# Patient Record
Sex: Female | Born: 1975 | Race: White | Hispanic: No | Marital: Married | Smoking: Never smoker
Health system: Southern US, Community
[De-identification: ages and names within clinical notes are randomized; demographics above are authoritative.]

## PROBLEM LIST (undated history)

## (undated) DIAGNOSIS — R55 Syncope and collapse: Secondary | ICD-10-CM

## (undated) DIAGNOSIS — R002 Palpitations: Secondary | ICD-10-CM

## (undated) DIAGNOSIS — R079 Chest pain, unspecified: Secondary | ICD-10-CM

## (undated) HISTORY — DX: Chest pain, unspecified: R07.9

## (undated) HISTORY — DX: Syncope and collapse: R55

## (undated) HISTORY — DX: Palpitations: R00.2

---

## 1997-10-05 ENCOUNTER — Other Ambulatory Visit: Admission: RE | Admit: 1997-10-05 | Discharge: 1997-10-05 | Payer: Self-pay | Admitting: Obstetrics and Gynecology

## 1998-12-08 ENCOUNTER — Other Ambulatory Visit: Admission: RE | Admit: 1998-12-08 | Discharge: 1998-12-08 | Payer: Self-pay | Admitting: Obstetrics and Gynecology

## 1999-12-17 ENCOUNTER — Emergency Department (HOSPITAL_COMMUNITY): Admission: EM | Admit: 1999-12-17 | Discharge: 1999-12-17 | Payer: Self-pay | Admitting: Emergency Medicine

## 2000-03-06 ENCOUNTER — Other Ambulatory Visit: Admission: RE | Admit: 2000-03-06 | Discharge: 2000-03-06 | Payer: Self-pay | Admitting: Obstetrics and Gynecology

## 2011-01-22 DIAGNOSIS — Z98891 History of uterine scar from previous surgery: Secondary | ICD-10-CM | POA: Insufficient documentation

## 2015-05-14 LAB — BASIC METABOLIC PANEL
BUN: 11 (ref 4–21)
Creatinine: 0.7 (ref 0.5–1.1)
Potassium: 4.1 (ref 3.4–5.3)
SODIUM: 139 (ref 137–147)

## 2015-05-14 LAB — HEPATIC FUNCTION PANEL
ALT: 10 (ref 7–35)
AST: 14 (ref 13–35)
Alkaline Phosphatase: 63 (ref 25–125)
BILIRUBIN, TOTAL: 0.8

## 2015-05-14 LAB — CBC AND DIFFERENTIAL
HEMATOCRIT: 39 (ref 36–46)
HEMOGLOBIN: 13.3 (ref 12.0–16.0)
PLATELETS: 268 (ref 150–399)
WBC: 5

## 2015-05-14 LAB — LIPID PANEL
Cholesterol: 235 — AB (ref 0–200)
HDL: 75 — AB (ref 35–70)
LDL Cholesterol: 148
TRIGLYCERIDES: 62 (ref 40–160)

## 2015-05-14 LAB — VITAMIN D 25 HYDROXY (VIT D DEFICIENCY, FRACTURES): Vit D, 25-Hydroxy: 20.8

## 2015-05-14 LAB — TSH: TSH: 0.49 (ref 0.41–5.90)

## 2016-05-09 LAB — HM PAP SMEAR: HM PAP: NEGATIVE

## 2016-06-06 ENCOUNTER — Encounter (HOSPITAL_COMMUNITY): Payer: Self-pay | Admitting: Emergency Medicine

## 2016-06-06 ENCOUNTER — Emergency Department (HOSPITAL_COMMUNITY): Payer: BLUE CROSS/BLUE SHIELD

## 2016-06-06 ENCOUNTER — Emergency Department (HOSPITAL_COMMUNITY)
Admission: EM | Admit: 2016-06-06 | Discharge: 2016-06-06 | Disposition: A | Discharge status: Home / Self Care | Payer: BLUE CROSS/BLUE SHIELD | Attending: Emergency Medicine | Admitting: Emergency Medicine

## 2016-06-06 DIAGNOSIS — R0789 Other chest pain: Secondary | ICD-10-CM | POA: Diagnosis not present

## 2016-06-06 LAB — LIPID PANEL
CHOLESTEROL: 223 — AB (ref 0–200)
HDL: 66 (ref 35–70)
LDL Cholesterol: 132
Triglycerides: 125 (ref 40–160)

## 2016-06-06 LAB — BASIC METABOLIC PANEL
Anion gap: 9 (ref 5–15)
BUN: 9 mg/dL (ref 6–20)
CALCIUM: 9.3 mg/dL (ref 8.9–10.3)
CO2: 23 mmol/L (ref 22–32)
CREATININE: 0.84 mg/dL (ref 0.44–1.00)
Chloride: 106 mmol/L (ref 101–111)
GFR calc non Af Amer: >60 mL/min (ref >60)
Glucose, Bld: 90 mg/dL (ref 65–99)
Potassium: 3.7 mmol/L (ref 3.5–5.1)
SODIUM: 138 mmol/L (ref 135–145)

## 2016-06-06 LAB — CBC
HCT: 42.6 % (ref 36.0–46.0)
Hemoglobin: 14.2 g/dL (ref 12.0–15.0)
MCH: 30.5 pg (ref 26.0–34.0)
MCHC: 33.3 g/dL (ref 30.0–36.0)
MCV: 91.6 fL (ref 78.0–100.0)
Platelets: 283 10*3/uL (ref 150–400)
RBC: 4.65 MIL/uL (ref 3.87–5.11)
RDW: 13 % (ref 11.5–15.5)
WBC: 9.1 10*3/uL (ref 4.0–10.5)

## 2016-06-06 LAB — TROPONIN I: Troponin I: <0.03 ng/mL (ref <0.03)

## 2016-06-06 LAB — I-STAT BETA HCG BLOOD, ED (MC, WL, AP ONLY): I-stat hCG, quantitative: <5 m[IU]/mL (ref <5)

## 2016-06-06 LAB — I-STAT TROPONIN, ED: TROPONIN I, POC: 0 ng/mL (ref 0.00–0.08)

## 2016-06-06 LAB — VITAMIN D 25 HYDROXY (VIT D DEFICIENCY, FRACTURES): Vit D, 25-Hydroxy: 31.7

## 2016-06-06 NOTE — ED Provider Notes (Signed)
[facility]-EMERGENCY DEPT Provider Note   CSN: [id number] Arrival date & time: 06/06/16  1552     History   Chief Complaint Chief Complaint  Patient presents with  . Chest Pain    HPI Judy Snow is a 41 y.o. female.  HPI Patient presents with concern of dizziness, chest pain. Patient was well until 2 days ago. She felt the relatively sudden onset of dizziness, at that time. Since onset symptoms of been intermittent, with no clear precipitating, alleviating factors, though symptoms seem worse with activity, and laying supine. Patient has had some congestion, and today took Sudafed in addition to previously provided Dramamine. Neither of these medications seems to have changed her condition. Today, a few hours prior to ED arrival the patient was symptomatic, when she suddenly felt increased palpitations, pressure in the left chest. These sensations have resolved spontaneously, and currently the patient states that she feels generally well aside from mild lightheadedness.  He denies history of any significant medical problems, has no recent medication changes, diet changes, activity changes. Family history of A. fib, early heart death.    History reviewed. No pertinent past medical history.  There are no active problems to display for this patient.   History reviewed. No pertinent surgical history.  OB History    No data available       Home Medications    Prior to Admission medications   Medication Sig Start Date End Date Taking? Authorizing Provider  dimenhyDRINATE (DRAMAMINE) 50 MG tablet Take 50 mg by mouth every 8 (eight) hours as needed for nausea or dizziness.   Yes Historical Provider, MD  pseudoephedrine (SUDAFED) 30 MG tablet Take 30 mg by mouth every 4 (four) hours as needed for congestion.   Yes Historical Provider, MD  vitamin C (ASCORBIC ACID) 250 MG tablet Take 250 mg by mouth daily as needed (IMMUNE).   Yes Historical Provider, MD    Family  History History reviewed. No pertinent family history.  Social History Social History  Substance Use Topics  . Smoking status: Never Smoker  . Smokeless tobacco: Never Used  . Alcohol use Yes     Comment: occ     Allergies   Shrimp [shellfish allergy]; Codeine; and Sulfa antibiotics   Review of Systems Review of Systems  Constitutional:       Per HPI, otherwise negative  HENT:       Per HPI, otherwise negative  Respiratory:       Per HPI, otherwise negative  Cardiovascular:       Per HPI, otherwise negative  Gastrointestinal: Negative for vomiting.  Endocrine:       Negative aside from HPI  Genitourinary:       Neg aside from HPI   Musculoskeletal:       Per HPI, otherwise negative  Skin: Negative.   Neurological: Positive for dizziness. Negative for syncope.     Physical Exam Updated Vital Signs BP 128/84   Pulse 95   Temp 98.9 F (37.2 C) (Oral)   Resp 18   SpO2 100%   Physical Exam  Constitutional: She is oriented to person, place, and time. She appears well-developed and well-nourished. No distress.  HENT:  Head: Normocephalic and atraumatic.  Eyes: Conjunctivae and EOM are normal.  Cardiovascular: Normal rate and regular rhythm.   Pulmonary/Chest: Effort normal and breath sounds normal. No stridor. No respiratory distress.  Abdominal: She exhibits no distension.  Musculoskeletal: She exhibits no edema.  Neurological: She is alert and  oriented to person, place, and time. No cranial nerve deficit or sensory deficit. She exhibits normal muscle tone. Coordination normal.  Skin: Skin is warm and dry.  Psychiatric: She has a normal mood and affect.  Nursing note and vitals reviewed.    ED Treatments / Results  Labs (all labs ordered are listed, but only abnormal results are displayed) Labs Reviewed  BASIC METABOLIC PANEL  CBC  TROPONIN I  I-STAT TROPOININ, ED  I-STAT BETA HCG BLOOD, ED ([facility], [facility], [facility] ONLY)    EKG  EKG  Interpretation  Date/Time:  Tuesday June 06 2016 16:03:32 EST Ventricular Rate:  111 PR Interval:  152 QRS Duration: 80 QT Interval:  320 QTC Calculation: 435 R Axis:   62 Text Interpretation:  Sinus tachycardia Possible Left atrial enlargement Nonspecific ST and T wave abnormality Abnormal ekg Confirmed by Gerhard MunchLOCKWOOD, Charlyne Robertshaw  MD [id number]) on 06/06/2016 8:04:21 PM       Radiology Dg Chest 2 View  Result Date: 06/06/2016 CLINICAL DATA:  Chest pain, dizziness, palpitations EXAM: CHEST  2 VIEW COMPARISON:  None available FINDINGS: The heart size and mediastinal contours are within normal limits. Both lungs are clear. The visualized skeletal structures are unremarkable. IMPRESSION: No active cardiopulmonary disease. Electronically Signed   By: Judie PetitM.  Shick M.D.   On: 06/06/2016 16:23    Procedures Procedures (including critical care time)   Initial Impression / Assessment and Plan / ED Course  I have reviewed the triage vital signs and the nursing notes.  Pertinent labs & imaging results that were available during my care of the patient were reviewed by me and considered in my medical decision making (see chart for details).  10:35 PM Now, following the return of a second troponin with reassuring value, patient has been reassessed. She continues to have no ongoing complaints. She, her mother Discussed all findings again, suspicion for arrhythmia, possibly with contribution from her recent use of Sudafed. With no ongoing pain, discomfort, lightheadedness, syncope, palpitations, patient will be discharged to follow-up with cardiology in the clinic for consideration of Holter monitoring.   Final Clinical Impressions(s) / ED Diagnoses  Palpitations Atypical chest pain dizziness   Gerhard Munchobert Stefany Starace, MD 06/06/16 2236

## 2016-06-06 NOTE — ED Notes (Signed)
Gave pt ice water, per Dr. Lockwood. 

## 2016-06-06 NOTE — Discharge Instructions (Signed)
As discussed, your evaluation today has been largely reassuring.  But, it is important that you monitor your condition carefully, and do not hesitate to return to the ED if you develop new, or concerning changes in your condition. ? ?Otherwise, please follow-up with your physician for appropriate ongoing care. ? ?

## 2016-06-06 NOTE — ED Triage Notes (Signed)
Pt sts dizziness yesterday and today; palpitations today and left sided CP into shoulder blade

## 2016-06-09 ENCOUNTER — Encounter: Payer: Self-pay | Admitting: *Deleted

## 2016-06-13 ENCOUNTER — Ambulatory Visit (INDEPENDENT_AMBULATORY_CARE_PROVIDER_SITE_OTHER): Payer: BLUE CROSS/BLUE SHIELD | Admitting: Nurse Practitioner

## 2016-06-13 ENCOUNTER — Encounter: Payer: Self-pay | Admitting: Nurse Practitioner

## 2016-06-13 ENCOUNTER — Encounter (INDEPENDENT_AMBULATORY_CARE_PROVIDER_SITE_OTHER): Payer: BLUE CROSS/BLUE SHIELD

## 2016-06-13 VITALS — BP 138/83 | HR 72 | Ht 68.0 in | Wt 210.4 lb

## 2016-06-13 DIAGNOSIS — R55 Syncope and collapse: Secondary | ICD-10-CM

## 2016-06-13 DIAGNOSIS — R072 Precordial pain: Secondary | ICD-10-CM | POA: Diagnosis not present

## 2016-06-13 DIAGNOSIS — R002 Palpitations: Secondary | ICD-10-CM | POA: Diagnosis not present

## 2016-06-13 NOTE — Patient Instructions (Signed)
Medication Instructions: Ward Givenshris Berge, NP recommends that you continue on your current medications as directed. Please refer to the Current Medication list given to you today.  Labwork: Your physician recommends that you return for lab work in TODAY.  Testing/Procedures: 1. Echocardiogram - Your physician has requested that you have an echocardiogram. Echocardiography is a painless test that uses sound waves to create images of your heart. It provides your doctor with information about the size and shape of your heart and how well your heart's chambers and valves are working. This procedure takes approximately one hour. There are no restrictions for this procedure. >This will be performed at our [facility] location - [address], [address].  2. Exercise Tolerance Test - Your physician has requested that you have an exercise tolerance test. For further information please visit [email or web address]. Please also follow instruction sheet, as given. >This will be performed at our [facility] location - [address], [address].  3. 30-Day Cardiac Event Monitor - Your physician has recommended that you wear an event monitor. Event monitors are medical devices that record the heart's electrical activity. Doctors most often us these monitors to diagnose arrhythmias. Arrhythmias are problems with the speed or rhythm of the heartbeat. The monitor is a small, portable device. You can wear one while you do your normal daily activities. This is usually used to diagnose what is causing palpitations/syncope (passing out).  Follow-up: Your physician recommends that you schedule a follow-up appointment in 1 month with Ward Givenshris Berge, NP.  If you need a refill on your cardiac medications before your next appointment, please call your pharmacy.

## 2016-06-13 NOTE — Progress Notes (Signed)
Cardiology Clinic Note   Patient Name: Judy Snow Date of Encounter: 06/13/2016  Primary Care Provider:  No primary care provider on file. Primary Cardiologist:  New - f/u D. Herbie Baltimore, MD   Patient Profile    41 y/o ? w/o prior cardiac History who presents for evaluation of chest pain, palpitations, and syncope.  Past Medical History    Past Medical History:  Diagnosis Date  . Chest pain   . Palpitations   . Syncope    a. 05/2016 while sitting in ER with atypical c/p and palpitations/sinus tachycardia.   No past surgical history on file.  Allergies  Allergies  Allergen Reactions  . Shrimp [Shellfish Allergy] Anaphylaxis and Rash  . Codeine Nausea And Vomiting  . Sulfa Antibiotics Rash    History of Present Illness    41 year old female without significant prior medical history. She notes intermittent palpitations occurring maybe 10 or so times since high school, lasting about 30 seconds, and resolving spontaneously. Each time this has occurred in the past, she was always leaning forward prior to onset of symptoms. She lives locally with her husband and 2 children. She is active without any recent changes in exercise tolerance, but does not routinely exercise. She has never smoked cigarettes.  She was in her usual state of health until February 13, when after leaning forward, she had onset of palpitations which she describes as a sensation of her heart feeling out of sync. Heart rate was elevated and it felt like her heart was beating hard. This was similar to prior episodes over the years however it persisted beyond 30 seconds despite lying down. In fact, it became associated with mild dyspnea and chest discomfort and persisted for over 3 hours prior to presenting to an urgent care for evaluation. There, heart rate was elevated and she presents to me in ECG from that visit which shows sinus tachycardia at a rate of 103 without acute ST or T changes. She says her heart rate  varied between 100 and 140 during her time at urgent care, and that they watched her for an hour. Because she reported chest discomfort, she was subsequently referred to the emergency department at [facility]. There, while sitting in the waiting room with her mother at her side, she began to show clammy and lightheaded and then had blackening of her vision and briefly lost consciousness. She was attended to by the ER staff. She says that her heart rate was elevated when they got to her. She did not have any nausea, vomiting, or diaphoresis. ECG showed sinus tachycardia at a rate of 111 without acute ST or T changes. Lab work was unrevealing and troponins were normal. She was discharged from the ED with recommendation for follow-up with cardiology. Since her ED visit, she has had no recurrence of symptoms.   Home Medications    Prior to Admission medications   Medication Sig Start Date End Date Taking? Authorizing Provider  dimenhyDRINATE (DRAMAMINE) 50 MG tablet Take 50 mg by mouth every 8 (eight) hours as needed for nausea or dizziness.    Historical Provider, MD  vitamin C (ASCORBIC ACID) 250 MG tablet Take 250 mg by mouth daily as needed (IMMUNE).    Historical Provider, MD    Family History    Family History  Problem Relation Age of Onset  . Hyperlipidemia Mother   . Hypothyroidism Father   . Ovarian cancer Maternal Grandmother   . Heart attack Cousin 30  Social History    Social History   Social History  . Marital status: Single    Spouse name: N/A  . Number of children: N/A  . Years of education: N/A   Occupational History  . Not on file.   Social History Main Topics  . Smoking status: Never Smoker  . Smokeless tobacco: Never Used  . Alcohol use Yes     Comment: occasional drink.  . Drug use: No  . Sexual activity: Not on file   Other Topics Concern  . Not on file   Social History Narrative   Lives locally with husband and two children, aged 5 & 7.  Does not  routinely exercise.  Enjoys photography.     Review of Systems    General:  No chills, fever, night sweats or weight changes.  Cardiovascular:  +++ chest pain in the setting of palpitations,  syncope during a recent ER visit, no dyspnea on exertion, edema, orthopnea, paroxysmal nocturnal dyspnea. Dermatological: No rash, lesions/masses Respiratory: No cough, +++ dyspneaIn the setting of palpitations.  Urologic: No hematuria, dysuria Abdominal:   No nausea, vomiting, diarrhea, bright red blood per rectum, melena, or hematemesis Neurologic:  No visual changes, wkns, changes in mental status. All other systems reviewed and are otherwise negative except as noted above.  Physical Exam    VS:  BP 138/83   Pulse 72   Ht 5\' 8"  (1.727 m)   Wt 210 lb 6.4 oz (95.4 kg)   BMI 31.99 kg/m  , BMI Body mass index is 31.99 kg/m. GEN: Well nourished, well developed, in no acute distress.  HEENT: normal.  Neck: Supple, no JVD, carotid bruits, or masses. Cardiac: RRR, no murmurs, rubs, or gallops. No clubbing, cyanosis, edema.  Radials/DP/PT 2+ and equal bilaterally.  Respiratory:  Respirations regular and unlabored, clear to auscultation bilaterally. GI: Soft, nontender, nondistended, BS + x 4. MS: no deformity or atrophy. Skin: warm and dry, no rash. Neuro:  Strength and sensation are intact. Psych: Normal affect.  Accessory Clinical Findings    ECG - From ER visit 06/06/2016 sinus tachycardia 111 nonspecific ST and T changes.-Personally reviewed  Lab Results  Component Value Date   WBC 9.1 06/06/2016   HGB 14.2 06/06/2016   HCT 42.6 06/06/2016   MCV 91.6 06/06/2016   PLT 283 06/06/2016   Lab Results  Component Value Date   CREATININE 0.84 06/06/2016   BUN 9 06/06/2016   NA 138 06/06/2016   K 3.7 06/06/2016   CL 106 06/06/2016   CO2 23 06/06/2016   Labs personally reviewed  Assessment & Plan   1.  Palpitations: Patient has a long history of brief and rare palpitations dating  back to high school, occurring once every few years, lasting 30 seconds, and resolving spontaneously. She had sudden onset of tachycardia palpitations on February 13 that became associated with lightheadedness and chest discomfort. She was noted to be in sinus tachycardia by the time she reached an urgent care. She was subsequently referred to the emergency department and experienced syncope while sitting and waiting. Follow-up ECG again showed sinus tachycardia. Lab work was unrevealing. She has had no recurrence of symptoms. I will arrange for a 30 day event monitor to rule out any arrhythmias that might have led to syncope, though I suspect that was a vasovagal reaction. Electrolytes were normal in the ED. A TSH was not evaluated and I will check this today.  2. Atypical chest pain: Patient had mild intermittent chest  discomfort in setting of palpitations. ECG was nonacute and troponins were normal. She has no family history of premature CAD. I suspect her risk is low. I will arrange for an exercise treadmill test.  3. Syncope: This occurred in the setting of palpitations was sitting in the emergency department. She was producing noted to have sinus tachycardia before that event. I suspect this represents vasovagal syncope given clamminess, and tunneling of vision prior to event. I will arrange for 2-D echo to evaluate LV function and rule out structural abnormalities. As above, arranging for event monitor.  4. Disposition: I will plan to see her back in approximately one month after testing completed.  Nicolasa Duckinghristopher Micky Overturf, NP 06/13/2016, 10:54 AM

## 2016-06-14 ENCOUNTER — Telehealth (HOSPITAL_COMMUNITY): Payer: Self-pay

## 2016-06-14 LAB — TSH: TSH: 1.25 mIU/L

## 2016-06-14 NOTE — Telephone Encounter (Signed)
Encounter complete. 

## 2016-06-15 ENCOUNTER — Ambulatory Visit (HOSPITAL_COMMUNITY)
Admission: RE | Admit: 2016-06-15 | Discharge: 2016-06-15 | Disposition: A | Discharge status: Home / Self Care | Payer: BLUE CROSS/BLUE SHIELD | Source: Ambulatory Visit | Attending: Cardiovascular Disease | Admitting: Cardiovascular Disease

## 2016-06-15 DIAGNOSIS — R072 Precordial pain: Secondary | ICD-10-CM | POA: Insufficient documentation

## 2016-06-15 LAB — EXERCISE TOLERANCE TEST
CSEPED: 9 min
Estimated workload: 11 METS
Exercise duration (sec): 35 s
MPHR: 180 {beats}/min
Peak HR: 176 {beats}/min
Percent HR: 97 %
RPE: 17
Rest HR: 68 {beats}/min

## 2016-06-29 ENCOUNTER — Ambulatory Visit (HOSPITAL_COMMUNITY): Payer: BLUE CROSS/BLUE SHIELD | Attending: Internal Medicine

## 2016-06-29 ENCOUNTER — Other Ambulatory Visit: Payer: Self-pay

## 2016-06-29 DIAGNOSIS — R55 Syncope and collapse: Secondary | ICD-10-CM | POA: Diagnosis not present

## 2016-07-04 ENCOUNTER — Telehealth: Payer: Self-pay | Admitting: Nurse Practitioner

## 2016-07-04 NOTE — Telephone Encounter (Signed)
Returned call to patient-patient will have her monitor completed on 3-21.  Wondering if appt should be moved until results are available.  Advised this would be best as results can be discusses at appt-appointment cancelled for this week and rescheduled with Ward Givenshris Berge NP on 4/12 at 11AM.  Patient aware and verbalized understanding.

## 2016-07-04 NOTE — Telephone Encounter (Signed)
New Message   Pt wants to know if her appt for Thursday should be moved to until after she gets her monitor off. Requests a call back to confirm before she reschedules.

## 2016-07-06 ENCOUNTER — Ambulatory Visit: Payer: BLUE CROSS/BLUE SHIELD | Admitting: Nurse Practitioner

## 2016-08-03 ENCOUNTER — Encounter: Payer: Self-pay | Admitting: Nurse Practitioner

## 2016-08-03 ENCOUNTER — Ambulatory Visit (INDEPENDENT_AMBULATORY_CARE_PROVIDER_SITE_OTHER): Payer: BLUE CROSS/BLUE SHIELD | Admitting: Nurse Practitioner

## 2016-08-03 VITALS — BP 115/70 | HR 70 | Ht 68.0 in | Wt 211.0 lb

## 2016-08-03 DIAGNOSIS — R0789 Other chest pain: Secondary | ICD-10-CM | POA: Diagnosis not present

## 2016-08-03 DIAGNOSIS — R55 Syncope and collapse: Secondary | ICD-10-CM

## 2016-08-03 DIAGNOSIS — R002 Palpitations: Secondary | ICD-10-CM | POA: Diagnosis not present

## 2016-08-03 NOTE — Patient Instructions (Signed)
Medication Instructions:  Your physician recommends that you continue on your current medications as directed. Please refer to the Current Medication list given to you today.  Follow-Up: Your physician recommends that you schedule a follow-up appointment:  AS NEEDED    Any Other Special Instructions Will Be Listed Below (If Applicable).     If you need a refill on your cardiac medications before your next appointment, please call your pharmacy.   

## 2016-08-03 NOTE — Progress Notes (Signed)
Office Visit    Patient Name: Judy Snow Date of Encounter: 08/03/2016  Primary Care Provider:  No PCP Per Patient Primary Cardiologist:  Ranae Palms, MD   Chief Complaint    41 year old female with a history of chest pain, palpitations, and syncope, who presents for follow-up after recent evaluation.  Past Medical History    Past Medical History:  Diagnosis Date  . Chest pain    a. 05/2016 ETT: Ex time 9:35, Max HR 176, upsloping ST dep-->Negative Adequate study.  . Palpitations    a. 05/2016 Event Monitor: Avg HR 76 (low 52, max 173), predominantly RSR. Lightheadedness assoc w/ RSR, c/p assoc w/ sinus tach and RSR, fluttering assoc w/ RSR & PAC's.  . Syncope    a. 05/2016 while sitting in ER with atypical c/p and palpitations/sinus tachycardia;  b. 06/2016 Echo: EF 55-60%.   No past surgical history on file.  Allergies  Allergies  Allergen Reactions  . Shrimp [Shellfish Allergy] Anaphylaxis and Rash  . Codeine Nausea And Vomiting  . Sulfa Antibiotics Rash    History of Present Illness    41 year old female without significant past medical history. She does have a history of intermittent palpitations occurring every few years dating back to high school, lasting about 30 seconds, and resolving spontaneously. In February, she had sudden onset of palpitations and tachycardia, which became associated with dyspnea and chest discomfort. She was seen in an urgent care where EKG showed sinus tachycardia at a rate of 103. She was later sent to the emergency room in the setting of her chest pain. While in the waiting area, she became clammy and lightheaded and briefly lost consciousness. Workup in the emergency department was unrevealing. I saw her in clinic on February 20 and obtained an exercise treadmill test which showed good exercise tolerance and was without acute ST or T changes. Echocardiogram was also performed and showed normal LV function. Lastly, an event monitor was  placed and did not show any significant arrhythmias. She did have some PACs associated with fluttering but for the most part remained in sinus rhythm. She did have at least one episode of chest discomfort associated with sinus tachycardia.  Today, she says that she was relatively asymptomatic over the period of time that she wore her monitor. She has not had any recurrence of symptoms that she would classify as being similar to what she experienced on the day that she presented to the emergency department. She remains active without chest pain, dyspnea, or significant palpitations.  Home Medications    Prior to Admission medications   Medication Sig Start Date End Date Taking? Authorizing Provider  Multiple Vitamins-Minerals (MULTIPLE VITAMINS/WOMENS PO) Take 1 tablet by mouth daily.   Yes Historical Provider, MD    Review of Systems    She denies chest pain, palpitations, dyspnea, pnd, orthopnea, n, v, dizziness, syncope, edema, weight gain, or early satiety.  All other systems reviewed and are otherwise negative except as noted above.  Physical Exam    VS:  BP 115/70   Pulse 70   Ht  (1.727 m)   Wt 211 lb (95.7 kg)   SpO2 98%   BMI 32.08 kg/m  , BMI Body mass index is 32.08 kg/m. GEN: Well nourished, well developed, in no acute distress.  HEENT: normal.  Neck: Supple, no JVD, carotid bruits, or masses. Cardiac: RRR, no murmurs, rubs, or gallops. No clubbing, cyanosis, edema.  Radials/DP/PT 2+ and equal bilaterally.  Respiratory:  Respirations regular and unlabored, clear to auscultation bilaterally. GI: Soft, nontender, nondistended, BS + x 4. MS: no deformity or atrophy. Skin: warm and dry, no rash. Neuro:  Strength and sensation are intact. Psych: Normal affect.  Accessory Clinical Findings    The following studies were reviewed in detail with the patient today:  05/2016 ETT: Ex time 9:35, Max HR 176, upsloping ST dep-->Negative Adequate study.  a. 05/2016 Event  Monitor: Avg HR 76 (low 52, max 173), predominantly RSR. Lightheadedness assoc w/ RSR, c/p assoc w/ sinus tach and RSR, fluttering assoc w/ RSR & PAC's.   06/2016 Echo: EF 55-60%.  Assessment & Plan    1.  Palpitations: Patient has a long history of intermittent and brief palpitations dating back to high school. She had prolonged tachycardia and palpitations in February with documentation of sinus tachycardia during that event. Laboratory evaluation was normal that day. I had obtained a TSH in clinic which was normal. Echocardiogram showed normal LV function while event monitoring did not show any significant arrhythmias. She did have occasional PACs associated with fluttering. She has been feeling well since her last visit without any recurrent sustained tachycardia/sinus tachycardia. No further workup indicated at this time.  2. Atypical chest pain: Status post exercise treadmill test which showed good exercise tolerance and no acute ST or T changes.  3. Syncope: This occurred while sitting in the waiting room in the emergency department after experiencing sinus tachycardia throughout much of that day. This most likely represents vasovagal syncope in the setting of developing clamminess and tunneling of vision. As above, echo showed normal LV function and there were no significant arrhythmias on event monitoring.  4. Disposition: Overall very reassuring workup. Patient has been feeling better without recurrence of symptoms. Follow-up when necessary.   Nicolasa Ducking, NP 08/03/2016, 11:16 AM

## 2016-09-19 ENCOUNTER — Ambulatory Visit (INDEPENDENT_AMBULATORY_CARE_PROVIDER_SITE_OTHER): Payer: BLUE CROSS/BLUE SHIELD | Admitting: Family Medicine

## 2016-09-19 ENCOUNTER — Encounter: Payer: Self-pay | Admitting: Family Medicine

## 2016-09-19 VITALS — BP 126/80 | HR 76 | Temp 98.2°F | Ht 68.0 in | Wt 212.6 lb

## 2016-09-19 DIAGNOSIS — R002 Palpitations: Secondary | ICD-10-CM

## 2016-09-19 DIAGNOSIS — G43109 Migraine with aura, not intractable, without status migrainosus: Secondary | ICD-10-CM | POA: Insufficient documentation

## 2016-09-19 LAB — COMPREHENSIVE METABOLIC PANEL
ALT: 20 U/L (ref 0–35)
AST: 21 U/L (ref 0–37)
Albumin: 4.2 g/dL (ref 3.5–5.2)
Alkaline Phosphatase: 55 U/L (ref 39–117)
BUN: 9 mg/dL (ref 6–23)
CO2: 27 mEq/L (ref 19–32)
Calcium: 9.3 mg/dL (ref 8.4–10.5)
Chloride: 107 mEq/L (ref 96–112)
Creatinine, Ser: 0.72 mg/dL (ref 0.40–1.20)
GFR: 94.89 mL/min (ref >60.00)
Glucose, Bld: 86 mg/dL (ref 70–99)
Potassium: 4.1 mEq/L (ref 3.5–5.1)
Sodium: 138 mEq/L (ref 135–145)
Total Bilirubin: 0.4 mg/dL (ref 0.2–1.2)
Total Protein: 6.8 g/dL (ref 6.0–8.3)

## 2016-09-19 LAB — CBC WITH DIFFERENTIAL/PLATELET
Basophils Absolute: 0 10*3/uL (ref 0.0–0.1)
Basophils Relative: 0.6 % (ref 0.0–3.0)
Eosinophils Absolute: 0.1 10*3/uL (ref 0.0–0.7)
Eosinophils Relative: 1.4 % (ref 0.0–5.0)
HCT: 40.1 % (ref 36.0–46.0)
Hemoglobin: 13.3 g/dL (ref 12.0–15.0)
Lymphocytes Relative: 37 % (ref 12.0–46.0)
Lymphs Abs: 2.3 10*3/uL (ref 0.7–4.0)
MCHC: 33.2 g/dL (ref 30.0–36.0)
MCV: 92.9 fl (ref 78.0–100.0)
Monocytes Absolute: 0.3 10*3/uL (ref 0.1–1.0)
Monocytes Relative: 5.6 % (ref 3.0–12.0)
Neutro Abs: 3.4 10*3/uL (ref 1.4–7.7)
Neutrophils Relative %: 55.4 % (ref 43.0–77.0)
Platelets: 280 10*3/uL (ref 150.0–400.0)
RBC: 4.31 Mil/uL (ref 3.87–5.11)
RDW: 13.1 % (ref 11.5–15.5)
WBC: 6.1 10*3/uL (ref 4.0–10.5)

## 2016-09-19 MED ORDER — ATENOLOL 25 MG PO TABS: 25.0000 mg | ORAL_TABLET | Freq: Every day | ORAL | 3 refills | Status: DC

## 2016-09-19 NOTE — Progress Notes (Signed)
Judy Snow is a 41 y.o. female is [facility].   Patient Care Team: Helane Rima, DO as PCP - General (Family Medicine)   History of Present Illness:   Joseph Art, CMA, acting as scribe for Dr. Earlene Plater.  Migraine   This is a recurrent problem. The current episode started more than 1 year ago. The problem occurs monthly. The problem has been gradually worsening. The pain is located in the bilateral and temporal region. The pain quality is not similar to prior headaches. The quality of the pain is described as aching and stabbing. The pain is severe. Associated symptoms include nausea, phonophobia, photophobia and a visual change (Floaters). Pertinent negatives include no abdominal pain, back pain, blurred vision, coughing, dizziness, ear pain, fever, neck pain, sore throat, vomiting or weakness. The symptoms are aggravated by menstrual cycle. She has tried NSAIDs for the symptoms. The treatment provided mild relief. Her past medical history is significant for migraine headaches.   Palpitations This is a recurrent problem. The episode started last year. She was evaluated by cardiology. Those notes were reviewed by me. She had a normal workup at that time, with the exception of normal sinus tachycardia. She was not started on any kind of medication. Patient says that since that time, she has been experiencing similar episodes. She has also started to experience episodes of feeling lightheaded as if she will pass out after she eats a big meal. She has the same feeling when she bends down to pick up something. She has had a few episodes of chest discomfort with this. No syncope. No overt leg edema or pain. Nonsmoker.  Health Maintenance Due  Topic Date Due  . HIV Screening  10/01/1990  . TETANUS/TDAP  10/01/1994   PMHx, SurgHx, SocialHx, Medications, and Allergies were reviewed in the Visit Navigator and updated as appropriate.   Past Medical History:  Diagnosis Date  .  Chest pain    a. 05/2016 ETT: Ex time 9:35, Max HR 176, upsloping ST dep-->Negative Adequate study.  . Palpitations    a. 05/2016 Event Monitor: Avg HR 76 (low 52, max 173), predominantly RSR. Lightheadedness assoc w/ RSR, c/p assoc w/ sinus tach and RSR, fluttering assoc w/ RSR & PAC's.  . Syncope    a. 05/2016 while sitting in ER with atypical c/p and palpitations/sinus tachycardia;  b. 06/2016 Echo: EF 55-60%.   No past surgical history on file.  Family History  Problem Relation Age of Onset  . Hyperlipidemia Mother   . Hypothyroidism Father   . Ovarian cancer Maternal Grandmother   . Heart attack Cousin 30   Social History  Substance Use Topics  . Smoking status: Never Smoker  . Smokeless tobacco: Never Used  . Alcohol use Yes     Comment: occasional drink.   Current Medications and Allergies:   .  calcium-vitamin D 250-100 MG-UNIT tablet, Take 1 tablet by mouth 2 (two) times daily., Disp: , Rfl:  .  Multiple Vitamins-Minerals (MULTIPLE VITAMINS/WOMENS PO), Take 1 tablet by mouth daily., Disp: , Rfl:   Allergies  Allergen Reactions  . Shrimp [Shellfish Allergy] Anaphylaxis and Rash  . Codeine Nausea And Vomiting  . Sulfa Antibiotics Rash   Review of Systems:   Review of Systems  Constitutional: Negative for chills and fever.  HENT: Negative for congestion, ear pain and sore throat.   Eyes: Positive for photophobia. Negative for blurred vision.  Respiratory: Negative for cough and shortness of breath.   Cardiovascular:  Negative for chest pain and palpitations.  Gastrointestinal: Positive for nausea. Negative for abdominal pain and vomiting.  Genitourinary: Negative for frequency.  Musculoskeletal: Negative for back pain and neck pain.  Skin: Negative for rash.  Neurological: Positive for headaches. Negative for dizziness, loss of consciousness and weakness.  Endo/Heme/Allergies: Does not bruise/bleed easily.  Psychiatric/Behavioral: Negative for depression. The patient  is not nervous/anxious.    Vitals:   Vitals:   09/19/16 0834  BP: 126/80  Pulse: 76  Temp: 98.2 F (36.8 C)  TempSrc: Oral  SpO2: 97%  Weight: 212 lb 9.6 oz (96.4 kg)  Height: 5\' 8"  (1.727 m)     Body mass index is 32.33 kg/m.  Physical Exam:   Physical Exam  Constitutional: She is oriented to person, place, and time. She appears well-developed and well-nourished. No distress.  HENT:  Head: Normocephalic and atraumatic.  Eyes: EOM are normal. Pupils are equal, round, and reactive to light.  Neck: Normal range of motion. Neck supple.  Cardiovascular: Normal rate, regular rhythm, normal heart sounds and intact distal pulses.   Pulmonary/Chest: Effort normal.  Abdominal: Soft.  Musculoskeletal: Normal range of motion.  Neurological: She is alert and oriented to person, place, and time. She displays normal reflexes. No cranial nerve deficit. Coordination normal.  Skin: Skin is warm.  Psychiatric: She has a normal mood and affect. Her behavior is normal.  Nursing note and vitals reviewed.  Assessment and Plan:   Judy Snow was seen today for establish care and migraine.  Diagnoses and all orders for this visit:  Migraine with aura and without status migrainosus, not intractable Comments: Identify triggers are menses, dehydration, and allergies. Patient has tried taking anti-inflammatories. She is at the point of risking rebound headaches. We discussed this issue. I recommend more aggressive hydration. I did review multiple methods of treatment for migraines, both as preventive and abortive treatments. Some of the medications discussed included Imitrex, Topamax, and atenolol. After discussing all benefits and risks, as well as considering her comorbid conditions, I recommend level of medication. Patient will consider medication. Orders: -     atenolol (TENORMIN) 25 MG tablet; Take 1 tablet (25 mg total) by mouth daily.  Palpitations Comments: Patient is experiencing further  symptoms. I will start atenolol for migraine prophylaxis. Labs pending. I'll ask cardiology to see the patient once more. Orders: -     CBC with Differential/Platelet -     Comprehensive metabolic panel -     Ambulatory referral to Cardiology  Records requested if needed. Time spent with the patient: 30 minutes, of which >50% was spent in obtaining information about her symptoms, reviewing her previous labs, evaluations, and treatments, counseling her about her condition (please see the discussed topics above), and developing a plan to further investigate it; she had a number of questions which I addressed.   . Reviewed expectations re: course of current medical issues. . Discussed self-management of symptoms. . Outlined signs and symptoms indicating need for more acute intervention. . Patient verbalized understanding and all questions were answered. Marland Kitchen Health Maintenance issues including appropriate healthy diet, exercise, and smoking avoidance were discussed with patient. . See orders for this visit as documented in the electronic medical record. . Patient received an After Visit Summary.  CMA served as Neurosurgeon during this visit. History, Physical, and Plan performed by medical provider. The above documentation has been reviewed and is accurate and complete. Helane Rima, D.O.  Helane Rima, DO , [facility] 09/19/2016  Future Appointments  Date Time Provider Department Center  10/23/2016 9:45 AM Helane RimaWallace, Drayden Lukas, DO [facility] None

## 2016-09-21 ENCOUNTER — Telehealth: Payer: Self-pay | Admitting: Surgical

## 2016-09-21 ENCOUNTER — Telehealth: Payer: Self-pay | Admitting: Nurse Practitioner

## 2016-09-21 NOTE — Telephone Encounter (Signed)
Per Dr. Earlene PlaterWallace referral message I called [facility] to get the patient in with the provider sooner. Front staff is going to call me back. I also spoke with the patient and she said that she has already had the stress test, echo, and ECG. She does not want to go back right now if they are not going to do further testing. Do you want to send a message to the cardiologist? I spoke with cardiology again and they said that if you are want a specific test then you can send over just for that.

## 2016-09-21 NOTE — Telephone Encounter (Signed)
error 

## 2016-09-21 NOTE — Telephone Encounter (Signed)
Patient called back due to phone being cut off while we was talking. She also said that she has wore a Holter Monitor. She said that the cardiologist said that everything was normal on it. She said that the symptoms only happen every once and awhile.

## 2016-09-21 NOTE — Telephone Encounter (Signed)
Patient called back returning call. The phone line ended. Please call back.

## 2016-09-27 ENCOUNTER — Encounter: Payer: Self-pay | Admitting: Family Medicine

## 2016-09-27 ENCOUNTER — Other Ambulatory Visit: Payer: Self-pay

## 2016-10-19 ENCOUNTER — Telehealth: Payer: Self-pay | Admitting: Family Medicine

## 2016-10-19 NOTE — Telephone Encounter (Signed)
FYI

## 2016-10-19 NOTE — Telephone Encounter (Signed)
Patient cancelled 07/02 follow up appointment for migraines. She wanted to let Dr. Earlene PlaterWallace know that she did not start taking the migraine medication due to being scared of the side effects. She has decided to try accupuncture.

## 2016-10-23 ENCOUNTER — Ambulatory Visit: Payer: BLUE CROSS/BLUE SHIELD | Admitting: Family Medicine

## 2017-02-15 ENCOUNTER — Telehealth: Payer: Self-pay | Admitting: Family Medicine

## 2017-02-15 NOTE — Telephone Encounter (Signed)
I have scheduled patient to come in to see Dr. Earlene PlaterWallace for tomorrow.

## 2017-02-15 NOTE — Telephone Encounter (Signed)
Patient called in reference to leaving for a cruise on 02/24/17. Patient stated she would like to have "sea sick patch" for the cruise. Patient would like to know if she needs to come in for this or if this is something that needs to be sent to her pharmacy. Please call patient and advise. OK to leave message.

## 2017-02-16 ENCOUNTER — Ambulatory Visit (INDEPENDENT_AMBULATORY_CARE_PROVIDER_SITE_OTHER): Payer: BLUE CROSS/BLUE SHIELD | Admitting: Family Medicine

## 2017-02-16 ENCOUNTER — Encounter: Payer: Self-pay | Admitting: Family Medicine

## 2017-02-16 VITALS — BP 124/70 | HR 68 | Temp 98.1°F | Ht 68.0 in | Wt 208.8 lb

## 2017-02-16 DIAGNOSIS — Z7189 Other specified counseling: Secondary | ICD-10-CM

## 2017-02-16 DIAGNOSIS — Z7184 Encounter for health counseling related to travel: Secondary | ICD-10-CM

## 2017-02-16 MED ORDER — SCOPOLAMINE 1 MG/3DAYS TD PT72: 1.0000 | MEDICATED_PATCH | TRANSDERMAL | 12 refills | Status: DC

## 2017-02-16 MED ORDER — KETOROLAC TROMETHAMINE 10 MG PO TABS: 10.0000 mg | ORAL_TABLET | Freq: Four times a day (QID) | ORAL | 0 refills | Status: DC | PRN

## 2017-02-16 NOTE — Progress Notes (Signed)
   Judy Snow is a 41 y.o. female here for an acute visit.  History of Present Illness:   Insurance claims handlerAmber Agner, CMA, acting as scribe for Dr. Earlene PlaterWallace.  HPI: Patient will be going on a cruise next week. She suffered from nausea the last time she went and requests scopolamine patches. She has never used them before. Hx of migraines. She did not try the Atenolol discussed at the last visit. Instead, she gets acupuncture around her menses, which is when her migraines happen. She states that this takes them down from migraines to headaches.   PMHx, SurgHx, SocialHx, Medications, and Allergies were reviewed in the Visit Navigator and updated as appropriate.  Current Medications:   .  calcium-vitamin D 250-100 MG-UNIT tablet, Take 1 tablet by mouth 2 (two) times daily., Disp: , Rfl:  .  Multiple Vitamins-Minerals (MULTIPLE VITAMINS/WOMENS PO), Take 1 tablet by mouth daily., Disp: , Rfl:    Allergies  Allergen Reactions  . Shrimp [Shellfish Allergy] Anaphylaxis and Rash  . Codeine Nausea And Vomiting  . Sulfa Antibiotics Rash   Review of Systems:   Pertinent items are noted in the HPI. Otherwise, ROS is negative.  Vitals:   Vitals:   02/16/17 1036  BP: 124/70  Pulse: 68  Temp: 98.1 F (36.7 C)  TempSrc: Oral  SpO2: 99%  Weight: 208 lb 12.8 oz (94.7 kg)  Height: 5\' 8"  (1.727 m)     Body mass index is 31.75 kg/m.   Physical Exam:   Physical Exam  Constitutional: She appears well-nourished.  HENT:  Head: Normocephalic and atraumatic.  Eyes: Pupils are equal, round, and reactive to light. EOM are normal.  Neck: Normal range of motion. Neck supple.  Cardiovascular: Normal rate, regular rhythm, normal heart sounds and intact distal pulses.   Pulmonary/Chest: Effort normal.  Abdominal: Soft.  Skin: Skin is warm.  Psychiatric: She has a normal mood and affect. Her behavior is normal.  Nursing note and vitals reviewed.   Assessment and Plan:   Judy Snow was seen today for acute  visit.  Diagnoses and all orders for this visit:  Travel advice encounter -     scopolamine (TRANSDERM-SCOP) 1 MG/3DAYS; Place 1 patch (1.5 mg total) onto the skin every 3 (three) days. -     ketorolac (TORADOL) 10 MG tablet; Take 1 tablet (10 mg total) by mouth every 6 (six) hours as needed.   . Reviewed expectations re: course of current medical issues. . Discussed self-management of symptoms. . Outlined signs and symptoms indicating need for more acute intervention. . Patient verbalized understanding and all questions were answered. Marland Kitchen. Health Maintenance issues including appropriate healthy diet, exercise, and smoking avoidance were discussed with patient. . See orders for this visit as documented in the electronic medical record. . Patient received an After Visit Summary.  CMA served as Neurosurgeonscribe during this visit. History, Physical, and Plan performed by medical provider. The above documentation has been reviewed and is accurate and complete. Helane RimaErica Acsa Estey, D.O.  Helane RimaErica Gracieann Stannard, DO [place], [facility] 02/16/2017

## 2017-06-08 ENCOUNTER — Ambulatory Visit: Payer: BLUE CROSS/BLUE SHIELD | Admitting: Family Medicine

## 2017-06-08 ENCOUNTER — Encounter: Payer: Self-pay | Admitting: Family Medicine

## 2017-06-08 VITALS — BP 118/68 | HR 87 | Temp 98.3°F | Ht 68.0 in | Wt 213.8 lb

## 2017-06-08 DIAGNOSIS — J029 Acute pharyngitis, unspecified: Secondary | ICD-10-CM | POA: Diagnosis not present

## 2017-06-08 DIAGNOSIS — R0981 Nasal congestion: Secondary | ICD-10-CM | POA: Diagnosis not present

## 2017-06-08 DIAGNOSIS — R509 Fever, unspecified: Secondary | ICD-10-CM | POA: Diagnosis not present

## 2017-06-08 LAB — POCT INFLUENZA A/B
INFLUENZA B, POC: NEGATIVE
Influenza A, POC: NEGATIVE

## 2017-06-08 LAB — POCT RAPID STREP A (OFFICE): Rapid Strep A Screen: NEGATIVE

## 2017-06-08 MED ORDER — IPRATROPIUM BROMIDE 0.06 % NA SOLN: 2.0000 | Freq: Four times a day (QID) | NASAL | 0 refills | Status: DC

## 2017-06-08 MED ORDER — METHYLPREDNISOLONE ACETATE 80 MG/ML IJ SUSP
80.0000 mg | Freq: Once | INTRAMUSCULAR | Status: AC
  Administered 2017-06-08: 80 mg via INTRAMUSCULAR

## 2017-06-08 MED ORDER — AZITHROMYCIN 250 MG PO TABS: ORAL_TABLET | ORAL | 0 refills | Status: DC

## 2017-06-08 NOTE — Progress Notes (Signed)
   Subjective:  Judy Snow is a 42 y.o. female who presents today for same-day appointment with a chief complaint of nasal congestion.   HPI:  Nasal Congestion, Acute Issue Symptoms started yesterday.  Worsened over that time.  Associated with fever to 103 F and chills.  No cough.  Mild sore throat.  She has tried taking Tylenol which helps some.  No obvious sick contacts.  No obvious precipitating events.  No other obvious alleviating or aggravating factors.  ROS: Per HPI  PMH: She reports that  has never smoked. she has never used smokeless tobacco. She reports that she drinks alcohol. She reports that she does not use drugs.  Objective:  Physical Exam: BP 118/68 (BP Location: Left Arm, Patient Position: Sitting, Cuff Size: Normal)   Pulse 87   Temp 98.3 F (36.8 C) (Oral)   Ht 5\' 8"  (1.727 m)   Wt 213 lb 12.8 oz (97 kg)   SpO2 96%   BMI 32.51 kg/m   Gen: NAD, resting comfortably HEENT: TMs with clear effusion bilaterally.  Oropharynx erythematous without exudate.  Maxillary sinuses clear to translation bilaterally.  Nasal mucosa erythematous and boggy with thick, white nasal discharge. CV: RRR with no murmurs appreciated Pulm: NWOB, CTAB with no crackles, wheezes, or rhonchi  Results for orders placed or performed in visit on 06/08/17 (from the past 24 hour(s))  POCT Influenza A/B     Status: None   Collection Time: 06/08/17 11:41 AM  Result Value Ref Range   Influenza A, POC Negative Negative   Influenza B, POC Negative Negative  POCT rapid strep A     Status: None   Collection Time: 06/08/17 11:42 AM  Result Value Ref Range   Rapid Strep A Screen Negative Negative   Assessment/Plan:  Fever/nasal congestion/sore throat Likely secondary to viral URI. No signs of bacterial infection. Start atrovent for rhinorrhea/sinus congestion. Will give 80mg  IM depo-medrol for sore throat. Sent in a "pocket prescription" for azithromycin with strict instruction to not start  unless symptoms worsen or fail to improve within the next several days. Recommended tylenol and/or motrin as needed for low grade fever and pain. Encouraged good oral hydration. Return precautions reviewed. Follow up as needed.   Katina Degreealeb M. Jimmey RalphParker, MD 06/08/2017 12:02 PM

## 2017-06-08 NOTE — Patient Instructions (Signed)
Start the atrovent.  Start the zpack if your symptoms worsen or do not improve in a few days.  Please stay well hydrated.  You can take tylenol and/or motrin as needed for low grade fever and pain.  Please let me know if your symptoms worsen or fail to improve.  Take care, Dr Parker  

## 2017-07-09 ENCOUNTER — Encounter: Payer: Self-pay | Admitting: Family Medicine

## 2017-07-09 ENCOUNTER — Ambulatory Visit: Payer: BLUE CROSS/BLUE SHIELD | Admitting: Family Medicine

## 2017-07-09 VITALS — BP 110/80 | HR 73 | Temp 97.4°F | Ht 68.0 in | Wt 210.4 lb

## 2017-07-09 DIAGNOSIS — L738 Other specified follicular disorders: Secondary | ICD-10-CM

## 2017-07-09 MED ORDER — CEPHALEXIN 500 MG PO CAPS
500.0000 mg | ORAL_CAPSULE | Freq: Four times a day (QID) | ORAL | 0 refills | Status: DC
Start: 2017-07-09 — End: 2018-05-08

## 2017-07-09 MED ORDER — CIPROFLOXACIN HCL 250 MG PO TABS: 250.0000 mg | ORAL_TABLET | Freq: Two times a day (BID) | ORAL | 0 refills | Status: AC

## 2017-07-09 NOTE — Progress Notes (Signed)
   Judy SawyerJennifer H Snow is a 42 y.o. female here for an acute visit.  History of Present Illness:   HPI: Patient comes in today for an acute visit. She has a rash on her abdomen and back that started this weekend. It does not itch. No new lotions, detergents, sick contacts, travel. No URI. She was in her jacuzzi last week. Sons have similar rashes but more mild. Treated for scabies to see if it helped - did not.   PMHx, SurgHx, SocialHx, Medications, and Allergies were reviewed in the Visit Navigator and updated as appropriate.  Current Medications:   .  calcium-vitamin D 250-100 MG-UNIT tablet, Take 1 tablet by mouth 2 (two) times daily., Disp: , Rfl:  .  ipratropium (ATROVENT) 0.06 % nasal spray, Place 2 sprays into both nostrils 4 (four) times daily., Disp: 15 mL, Rfl: 0 .  Multiple Vitamins-Minerals (MULTIPLE VITAMINS/WOMENS PO), Take 1 tablet by mouth daily., Disp: , Rfl:    Allergies  Allergen Reactions  . Shrimp [Shellfish Allergy] Anaphylaxis and Rash  . Codeine Nausea And Vomiting  . Sulfa Antibiotics Rash   Review of Systems:   Pertinent items are noted in the HPI. Otherwise, ROS is negative.  Vitals:   Vitals:   07/09/17 1116  BP: 110/80  Pulse: 73  Temp: (!) 97.4 F (36.3 C)  TempSrc: Oral  SpO2: 94%  Weight: 210 lb 6.4 oz (95.4 kg)  Height: 5\' 8"  (1.727 m)     Body mass index is 31.99 kg/m.  Physical Exam:   Physical Exam  Constitutional: She appears well-developed and well-nourished. No distress.  HENT:  Head: Normocephalic and atraumatic.  Eyes: EOM are normal. Pupils are equal, round, and reactive to light.  Neck: Normal range of motion. Neck supple.  Cardiovascular: Normal rate, regular rhythm, normal heart sounds and intact distal pulses.  Pulmonary/Chest: Effort normal.  Abdominal: Soft.  Skin: Skin is warm. Rash noted. No purpura noted. Rash is maculopapular and pustular.  Psychiatric: She has a normal mood and affect. Her behavior is normal.    Nursing note and vitals reviewed.   Assessment and Plan:   1. Hot tub folliculitis - ciprofloxacin (CIPRO) 250 MG tablet; Take 1 tablet (250 mg total) by mouth 2 (two) times daily for 7 days.  Dispense: 14 tablet; Refill: 0   . Reviewed expectations re: course of current medical issues. . Discussed self-management of symptoms. . Outlined signs and symptoms indicating need for more acute intervention. . Patient verbalized understanding and all questions were answered. Marland Kitchen. Health Maintenance issues including appropriate healthy diet, exercise, and smoking avoidance were discussed with patient. . See orders for this visit as documented in the electronic medical record. . Patient received an After Visit Summary.  Helane RimaErica Bari Leib, DO [place], [facility] 07/09/2017

## 2017-07-09 NOTE — Addendum Note (Signed)
Addended by: Dorian PodWHEELEY, Jermiah Howton J on: 07/09/2017 12:23 PM   Modules accepted: Orders

## 2017-07-09 NOTE — Progress Notes (Deleted)
   Judy Snow is a 42 y.o. female here for an acute visit.  History of Present Illness:  Britt BottomJamie Wheeley CMA acting as scribe for Dr. Earlene PlaterWallace.  HPI: Patient comes in today for an acute visit. She has a rash on her abdomin and back that started this weekend. It does not itch.   PMHx, SurgHx, SocialHx, Medications, and Allergies were reviewed in the Visit Navigator and updated as appropriate.  Review of Systems  Constitutional: Negative for chills and fever.  HENT: Negative for congestion and ear pain.   Eyes: Negative for blurred vision and double vision.  Respiratory: Negative for cough and shortness of breath.   Cardiovascular: Negative for chest pain and palpitations.  Gastrointestinal: Negative for abdominal pain and vomiting.  Genitourinary: Negative for dysuria.  Musculoskeletal: Negative for back pain and neck pain.  Skin: Positive for rash. Negative for itching.  Neurological: Negative for dizziness and headaches.  Psychiatric/Behavioral: Negative for depression and suicidal ideas.    Current Medications:   Current Outpatient Medications:  .  calcium-vitamin D 250-100 MG-UNIT tablet, Take 1 tablet by mouth 2 (two) times daily., Disp: , Rfl:  .  ipratropium (ATROVENT) 0.06 % nasal spray, Place 2 sprays into both nostrils 4 (four) times daily., Disp: 15 mL, Rfl: 0 .  Multiple Vitamins-Minerals (MULTIPLE VITAMINS/WOMENS PO), Take 1 tablet by mouth daily., Disp: , Rfl:    Allergies  Allergen Reactions  . Shrimp [Shellfish Allergy] Anaphylaxis and Rash  . Codeine Nausea And Vomiting  . Sulfa Antibiotics Rash   Review of Systems:   Pertinent items are noted in the HPI. Otherwise, ROS is negative.  Vitals:   Vitals:   07/09/17 1116  BP: 110/80  Pulse: 73  Temp: (!) 97.4 F (36.3 C)  TempSrc: Oral  SpO2: 94%  Weight: 210 lb 6.4 oz (95.4 kg)  Height: 5\' 8"  (1.727 m)     Body mass index is 31.99 kg/m.  Physical Exam:   Physical Exam Results for orders  placed or performed in visit on 06/08/17  POCT Influenza A/B  Result Value Ref Range   Influenza A, POC Negative Negative   Influenza B, POC Negative Negative  POCT rapid strep A  Result Value Ref Range   Rapid Strep A Screen Negative Negative    Assessment and Plan:   ***

## 2018-02-07 ENCOUNTER — Ambulatory Visit (INDEPENDENT_AMBULATORY_CARE_PROVIDER_SITE_OTHER): Payer: BLUE CROSS/BLUE SHIELD

## 2018-02-07 ENCOUNTER — Encounter: Payer: Self-pay | Admitting: Family Medicine

## 2018-02-07 DIAGNOSIS — Z23 Encounter for immunization: Secondary | ICD-10-CM

## 2018-05-08 ENCOUNTER — Ambulatory Visit: Payer: BLUE CROSS/BLUE SHIELD | Admitting: Family Medicine

## 2018-05-08 ENCOUNTER — Ambulatory Visit (INDEPENDENT_AMBULATORY_CARE_PROVIDER_SITE_OTHER): Payer: BLUE CROSS/BLUE SHIELD

## 2018-05-08 ENCOUNTER — Encounter: Payer: Self-pay | Admitting: Family Medicine

## 2018-05-08 VITALS — BP 108/74 | HR 77 | Temp 98.6°F | Ht 68.0 in | Wt 218.0 lb

## 2018-05-08 DIAGNOSIS — G43109 Migraine with aura, not intractable, without status migrainosus: Secondary | ICD-10-CM | POA: Diagnosis not present

## 2018-05-08 DIAGNOSIS — Z13220 Encounter for screening for lipoid disorders: Secondary | ICD-10-CM | POA: Diagnosis not present

## 2018-05-08 DIAGNOSIS — M533 Sacrococcygeal disorders, not elsewhere classified: Secondary | ICD-10-CM

## 2018-05-08 DIAGNOSIS — E538 Deficiency of other specified B group vitamins: Secondary | ICD-10-CM | POA: Diagnosis not present

## 2018-05-08 DIAGNOSIS — S3992XS Unspecified injury of lower back, sequela: Secondary | ICD-10-CM

## 2018-05-08 DIAGNOSIS — R5383 Other fatigue: Secondary | ICD-10-CM

## 2018-05-08 DIAGNOSIS — E559 Vitamin D deficiency, unspecified: Secondary | ICD-10-CM | POA: Diagnosis not present

## 2018-05-08 LAB — COMPREHENSIVE METABOLIC PANEL
ALT: 11 U/L (ref 0–35)
AST: 12 U/L (ref 0–37)
Albumin: 4.1 g/dL (ref 3.5–5.2)
Alkaline Phosphatase: 54 U/L (ref 39–117)
BUN: 9 mg/dL (ref 6–23)
CO2: 27 mEq/L (ref 19–32)
Calcium: 9 mg/dL (ref 8.4–10.5)
Chloride: 105 mEq/L (ref 96–112)
Creatinine, Ser: 0.79 mg/dL (ref 0.40–1.20)
GFR: 84.58 mL/min (ref >60.00)
Glucose, Bld: 86 mg/dL (ref 70–99)
Potassium: 4 mEq/L (ref 3.5–5.1)
Sodium: 138 mEq/L (ref 135–145)
Total Bilirubin: 0.4 mg/dL (ref 0.2–1.2)
Total Protein: 6.4 g/dL (ref 6.0–8.3)

## 2018-05-08 LAB — CBC WITH DIFFERENTIAL/PLATELET
Basophils Absolute: 0 10*3/uL (ref 0.0–0.1)
Basophils Relative: 0.5 % (ref 0.0–3.0)
Eosinophils Absolute: 0.1 10*3/uL (ref 0.0–0.7)
Eosinophils Relative: 1.2 % (ref 0.0–5.0)
HCT: 40.9 % (ref 36.0–46.0)
Hemoglobin: 13.6 g/dL (ref 12.0–15.0)
Lymphocytes Relative: 32.4 % (ref 12.0–46.0)
Lymphs Abs: 2.3 10*3/uL (ref 0.7–4.0)
MCHC: 33.3 g/dL (ref 30.0–36.0)
MCV: 92.6 fl (ref 78.0–100.0)
Monocytes Absolute: 0.4 10*3/uL (ref 0.1–1.0)
Monocytes Relative: 6 % (ref 3.0–12.0)
Neutro Abs: 4.3 10*3/uL (ref 1.4–7.7)
Neutrophils Relative %: 59.9 % (ref 43.0–77.0)
Platelets: 279 10*3/uL (ref 150.0–400.0)
RBC: 4.42 Mil/uL (ref 3.87–5.11)
RDW: 12.8 % (ref 11.5–15.5)
WBC: 7.2 10*3/uL (ref 4.0–10.5)

## 2018-05-08 LAB — LIPID PANEL
Cholesterol: 214 mg/dL — ABNORMAL HIGH (ref 0–200)
HDL: 64.4 mg/dL (ref >39.00)
LDL Cholesterol: 115 mg/dL — ABNORMAL HIGH (ref 0–99)
NonHDL: 149.95
Total CHOL/HDL Ratio: 3
Triglycerides: 174 mg/dL — ABNORMAL HIGH (ref 0.0–149.0)
VLDL: 34.8 mg/dL (ref 0.0–40.0)

## 2018-05-08 LAB — VITAMIN D 25 HYDROXY (VIT D DEFICIENCY, FRACTURES): VITD: 26.93 ng/mL — ABNORMAL LOW (ref 30.00–100.00)

## 2018-05-08 LAB — TSH: TSH: 0.8 u[IU]/mL (ref 0.35–4.50)

## 2018-05-08 LAB — VITAMIN B12: Vitamin B-12: 397 pg/mL (ref 211–911)

## 2018-05-08 MED ORDER — TOPIRAMATE ER 25 MG PO CAP24: 1.0000 | ORAL_CAPSULE | Freq: Every day | ORAL | 2 refills | Status: DC

## 2018-05-08 NOTE — Patient Instructions (Signed)

## 2018-05-08 NOTE — Progress Notes (Signed)
Judy Snow is a 43 y.o. female is here for follow up.  History of Present Illness:   Barnie Mort, CMA acting as scribe for Dr. Helane Rima.   HPI: Patient in office for evaluation of ongoing migraines and pain in tailbone.   Migraine: Patient is having 4-5 a month that are lasting 1 to 3 days. She has nausea no Vomiting with sensitivity to light and sound. Has been on atenolol as preventative in the past but did not help with it much. She has taken Imitrex 100mg  but did not like the side effects from it. She would like to review options for maintenance medication to prevent them.   Pain: Patient had fall about 60yrs ago that caused tale bone injury. She had xrays done at the time and was given conservative treatment. Started having increased pain about 8 months ago. It has progressively gotten worse. Pain increases  when setting on hard chars or sitting for long periods of time. Pain can get to 8/10 and it is sharp pain. She has taken over the counter ibuprofen with little help.    Health Maintenance Due  Topic Date Due  . TETANUS/TDAP  10/01/1994   Depression screen [facility] 2/9 05/08/2018 02/16/2017 09/19/2016  Decreased Interest 0 0 0  Down, Depressed, Hopeless 0 0 0  PHQ - 2 Score 0 0 0  Altered sleeping 0 - -  Tired, decreased energy 0 - -  Change in appetite 0 - -  Feeling bad or failure about yourself  0 - -  Trouble concentrating 0 - -  Moving slowly or fidgety/restless 0 - -  Suicidal thoughts 0 - -  PHQ-9 Score 0 - -  Difficult doing work/chores Not difficult at all - -   PMHx, SurgHx, SocialHx, FamHx, Medications, and Allergies were reviewed in the Visit Navigator and updated as appropriate.   Patient Active Problem List   Diagnosis Date Noted  . Palpitations 09/19/2016  . Migraine with aura and without status migrainosus, not intractable 09/19/2016  . History of cesarean section, low transverse 01/22/2011   Social History   Tobacco Use  . Smoking status:  Never Smoker  . Smokeless tobacco: Never Used  Substance Use Topics  . Alcohol use: Yes    Comment: occasional drink.  . Drug use: No   Current Medications and Allergies:   .  calcium-vitamin D 250-100 MG-UNIT tablet, Take 1 tablet by mouth 2 (two) times daily., Disp: , Rfl:  .  ipratropium (ATROVENT) 0.06 % nasal spray, Place 2 sprays into both nostrils 4 (four) times daily., Disp: 15 mL, Rfl: 0 .  Multiple Vitamins-Minerals (MULTIPLE VITAMINS/WOMENS PO), Take 1 tablet by mouth daily., Disp: , Rfl:    Allergies  Allergen Reactions  . Shrimp [Shellfish Allergy] Anaphylaxis and Rash  . Codeine Nausea And Vomiting  . Sulfa Antibiotics Rash   Review of Systems   Pertinent items are noted in the HPI. Otherwise, a complete ROS is negative.  Vitals:   Vitals:   05/08/18 1319  BP: 108/74  Pulse: 77  Temp: 98.6 F (37 C)  TempSrc: Oral  SpO2: 99%  Weight: 218 lb (98.9 kg)  Height: 5\' 8"  (1.727 m)     Body mass index is 33.15 kg/m.  Physical Exam:   Physical Exam Vitals signs and nursing note reviewed.  HENT:     Head: Normocephalic and atraumatic.  Eyes:     Pupils: Pupils are equal, round, and reactive to light.  Neck:  Musculoskeletal: Normal range of motion and neck supple.  Cardiovascular:     Rate and Rhythm: Normal rate and regular rhythm.     Heart sounds: Normal heart sounds.  Pulmonary:     Effort: Pulmonary effort is normal.  Abdominal:     Palpations: Abdomen is soft.  Skin:    General: Skin is warm.  Psychiatric:        Behavior: Behavior normal.    Assessment and Plan:   [name] was seen today for migraine.  Diagnoses and all orders for this visit:  Migraine with aura and without status migrainosus, not intractable -     CBC with Differential/Platelet -     Comprehensive metabolic panel -     Topiramate ER (TROKENDI XR) 25 MG CP24; Take 1 tablet by mouth daily.  Coccydynia -     DG Sacrum/Coccyx  Vitamin D deficiency -     VITAMIN  D 25 Hydroxy (Vit-D Deficiency, Fractures)  B12 deficiency -     Vitamin B12  Fatigue, unspecified type -     TSH  Screening for lipid disorders -     Lipid panel    . Orders and follow up as documented in EpicCare, reviewed diet, exercise and weight control, cardiovascular risk and specific lipid/LDL goals reviewed, reviewed medications and side effects in detail.  . Reviewed expectations re: course of current medical issues. . Outlined signs and symptoms indicating need for more acute intervention. . Patient verbalized understanding and all questions were answered. . Patient received an After Visit Summary.  CMA served as Neurosurgeonscribe during this visit. History, Physical, and Plan performed by medical provider. The above documentation has been reviewed and is accurate and complete. Helane RimaErica Rowena Moilanen, D.O.  Helane RimaErica Creg Gilmer, DO [place], [place] [facility] 05/09/2018

## 2018-05-09 ENCOUNTER — Encounter: Payer: Self-pay | Admitting: Family Medicine

## 2018-05-22 ENCOUNTER — Ambulatory Visit: Payer: Self-pay | Admitting: *Deleted

## 2018-05-22 NOTE — Telephone Encounter (Signed)
Saw Dr. Earlene Plater with migraines on 05/08/18. She was started on a new migraine medication, topiramate ER 25 MG daily.  Reporting she has been having daily intermittent headaches, different from her migraines since she started this medication. Stated she has a headache almost every day that improves with tylenol then returns later during the day. She denies any congestion/facial pressure/eye pain N/V/ cough/ST/ear pain/vision changes. Advice care reviewed including trying flonase and antihistamine to see if it helps daily headaches. Reported she had a migraine last week that lasted nearly 3 days. Routing to PCP for further advice.   Reason for Disposition . Headache  Answer Assessment - Initial Assessment Questions 1. LOCATION: "Where does it hurt?"      From the temples on back 2. ONSET: "When did the headache start?" (Minutes, hours or days)      Daily headaches since starting new migraine medication about 3 weeks ago. 3. PATTERN: "Does the pain come and go, or has it been constant since it started?"     Intermittent daily headaches 4. SEVERITY: "How bad is the pain?" and "What does it keep you from doing?"  (e.g., Scale 1-10; mild, moderate, or severe)   - MILD (1-3): doesn't interfere with normal activities    - MODERATE (4-7): interferes with normal activities or awakens from sleep    - SEVERE (8-10): excruciating pain, unable to do any normal activities        Mild to moderate-she has to continue to care for small children. 5. RECURRENT SYMPTOM: "Have you ever had headaches before?" If so, ask: "When was the last time?" and "What happened that time?"      Not these daily headaches 6. CAUSE: "What do you think is causing the headache?"     Unsure but thinks may be due to new migraine medication 7. MIGRAINE: "Have you been diagnosed with migraine headaches?" If so, ask: "Is this headache similar?"      Yes she has been dx but these daily headaches don't feel the same. 8. HEAD INJURY: "Has  there been any recent injury to the head?"      *No Answer* 9. OTHER SYMPTOMS: "Do you have any other symptoms?" (fever, stiff neck, eye pain, sore throat, cold symptoms)     *No Answer* 10. PREGNANCY: "Is there any chance you are pregnant?" "When was your last menstrual period?"       *No Answer*  Protocols used: HEADACHE-A-AH

## 2018-05-22 NOTE — Telephone Encounter (Signed)
Called patient she states daily HA are a 2/10. She is on the way to eye doctor to have evaluation to see if that may be reason. I have made app with you for tomorrow. If symptoms increase or change she will call or go to ED

## 2018-05-22 NOTE — Telephone Encounter (Signed)
See note

## 2018-05-23 ENCOUNTER — Encounter: Payer: Self-pay | Admitting: Physician Assistant

## 2018-05-23 ENCOUNTER — Ambulatory Visit: Payer: BLUE CROSS/BLUE SHIELD | Admitting: Physician Assistant

## 2018-05-23 VITALS — BP 120/64 | HR 90 | Temp 98.6°F | Ht 68.0 in | Wt 214.0 lb

## 2018-05-23 DIAGNOSIS — Z23 Encounter for immunization: Secondary | ICD-10-CM | POA: Diagnosis not present

## 2018-05-23 DIAGNOSIS — G43109 Migraine with aura, not intractable, without status migrainosus: Secondary | ICD-10-CM | POA: Diagnosis not present

## 2018-05-23 NOTE — Patient Instructions (Signed)
It was great to see you!  Continue Trokendi daily.  You should be getting a call from Neurology for your appointment.  Take care,  [name] PA-C

## 2018-05-23 NOTE — Progress Notes (Signed)
Judy Snow is a 43 y.o. female here for a new problem.  I,Judy Snow,acting as a Neurosurgeon for [employer], PA.,have documented all relevant documentation on the behalf of Judy Motto, PA,as directed by  Judy Motto, PA while in the presence of Judy Snow, Judy Snow.   History of Present Illness:   Chief Complaint  Patient presents with  . Headache   She started Trokendi after visit with PCP on 05/08/18. 4 days after starting it she had some intermittent HA, it waxes and wanes and is here to discuss this today.  Headache   This is a new problem. The problem occurs daily. The pain is located in the bilateral (last week was more on rt than lt ) region. The pain does not radiate. The pain quality is not similar to prior headaches. The quality of the pain is described as aching. The pain is at a severity of 2/10. The pain is mild. Pertinent negatives include no abdominal pain, blurred vision, ear pain, eye pain, scalp tenderness, sinus pressure or tinnitus. Associated symptoms comments: Is having sensitivity to light . Nothing aggravates the symptoms. She has tried nothing for the symptoms. The treatment provided no relief. Her past medical history is significant for migraine headaches. There is no history of cancer, hypertension or TMJ.     Past Medical History:  Diagnosis Date  . Chest pain    a. 05/2016 ETT: Ex time 9:35, Max HR 176, upsloping ST dep-->Negative Adequate study.  . Palpitations    a. 05/2016 Event Monitor: Avg HR 76 (low 52, max 173), predominantly RSR. Lightheadedness assoc w/ RSR, c/p assoc w/ sinus tach and RSR, fluttering assoc w/ RSR & PAC's.  . Syncope    a. 05/2016 while sitting in ER with atypical c/p and palpitations/sinus tachycardia;  b. 06/2016 Echo: EF 55-60%.     Social History   Socioeconomic History  . Marital status: Married    Spouse name: Not on file  . Number of children: Not on file  . Years of education: Not on file  . Highest  education level: Not on file  Occupational History  . Not on file  Social Needs  . Financial resource strain: Not on file  . Food insecurity:    Worry: Not on file    Inability: Not on file  . Transportation needs:    Medical: Not on file    Non-medical: Not on file  Tobacco Use  . Smoking status: Never Smoker  . Smokeless tobacco: Never Used  Substance and Sexual Activity  . Alcohol use: Yes    Comment: occasional drink.  . Drug use: No  . Sexual activity: Not on file  Lifestyle  . Physical activity:    Days per week: Not on file    Minutes per session: Not on file  . Stress: Not on file  Relationships  . Social connections:    Talks on phone: Not on file    Gets together: Not on file    Attends religious service: Not on file    Active member of club or organization: Not on file    Attends meetings of clubs or organizations: Not on file    Relationship status: Not on file  . Intimate partner violence:    Fear of current or ex partner: Not on file    Emotionally abused: Not on file    Physically abused: Not on file    Forced sexual activity: Not on file  Other Topics Concern  .  Not on file  Social History Narrative   Lives locally with husband and two children, aged 5 & 7.  Does not routinely exercise.  Enjoys photography.    No past surgical history on file.  Family History  Problem Relation Age of Onset  . Hyperlipidemia Mother   . Hypothyroidism Father   . Ovarian cancer Maternal Grandmother   . Heart attack Cousin 30    Allergies  Allergen Reactions  . Shrimp [Shellfish Allergy] Anaphylaxis and Rash  . Codeine Nausea And Vomiting  . Sulfa Antibiotics Rash    Current Medications:   Current Outpatient Medications:  .  calcium-vitamin D 250-100 MG-UNIT tablet, Take 1 tablet by mouth 2 (two) times daily., Disp: , Rfl:  .  Multiple Vitamins-Minerals (MULTIPLE VITAMINS/WOMENS PO), Take 1 tablet by mouth daily., Disp: , Rfl:  .  Topiramate ER (TROKENDI  XR) 25 MG CP24, Take 1 tablet by mouth daily., Disp: 30 capsule, Rfl: 2   Review of Systems:   Review of Systems  HENT: Negative for ear pain, sinus pressure and tinnitus.   Eyes: Negative for blurred vision and pain.  Gastrointestinal: Negative for abdominal pain.  Neurological: Positive for headaches.    Vitals:   Vitals:   05/23/18 1013  BP: 120/64  Pulse: 90  Temp: 98.6 F (37 C)  TempSrc: Oral  SpO2: 98%  Weight: 214 lb (97.1 kg)  Height: 5\' 8"  (1.727 m)     Body mass index is 32.54 kg/m.  Physical Exam:   Physical Exam Vitals signs and nursing note reviewed.  Constitutional:      General: She is not in acute distress.    Appearance: She is well-developed. She is not ill-appearing or toxic-appearing.  Cardiovascular:     Rate and Rhythm: Normal rate and regular rhythm.     Pulses: Normal pulses.     Heart sounds: Normal heart sounds, S1 normal and S2 normal.     Comments: No LE edema Pulmonary:     Effort: Pulmonary effort is normal.     Breath sounds: Normal breath sounds.  Skin:    General: Skin is warm and dry.  Neurological:     General: No focal deficit present.     Mental Status: She is alert.     GCS: GCS eye subscore is 4. GCS verbal subscore is 5. GCS motor subscore is 6.     Cranial Nerves: Cranial nerves are intact.     Sensory: Sensation is intact.     Motor: Motor function is intact.     Gait: Gait is intact.  Psychiatric:        Speech: Speech normal.        Behavior: Behavior normal. Behavior is cooperative.     Assessment and Plan:   [name] was seen today for headache.  Diagnoses and all orders for this visit:  Migraine with aura and without status migrainosus, not intractable Neuro exam benign. Discussed various options, such as continuing Trokendi 25 mg, increasing dose, stopping medication, however she decided after shared decision making to continue Trokendi 25 mg and proceed with neuro eval for further work-up. I also  discussed avoiding excessive use of OTC meds to prevent rebound HA. Headache log provided as well. -     Ambulatory referral to Neurology  Need for Tdap vaccination -     Tdap vaccine greater than or equal to 7yo IM  . Reviewed expectations re: course of current medical issues. . Discussed self-management of symptoms. .Marland Kitchen  Outlined signs and symptoms indicating need for more acute intervention. . Patient verbalized understanding and all questions were answered. . See orders for this visit as documented in the electronic medical record. . Patient received an After-Visit Summary.  CMA or LPN served as scribe during this visit. History, Physical, and Plan performed by medical provider. The above documentation has been reviewed and is accurate and complete.  Inda Coke, PA-C

## 2018-05-28 ENCOUNTER — Encounter: Payer: Self-pay | Admitting: Neurology

## 2018-06-13 ENCOUNTER — Encounter: Payer: Self-pay | Admitting: Licensed Clinical Social Worker

## 2018-06-13 ENCOUNTER — Telehealth: Payer: Self-pay | Admitting: Licensed Clinical Social Worker

## 2018-06-13 NOTE — Telephone Encounter (Signed)
A genetic counseling appt has been scheduled for the pt to see Lacy Duverney on 3/16 at 11am. Letter mailed to the pt.

## 2018-07-08 ENCOUNTER — Other Ambulatory Visit: Payer: BLUE CROSS/BLUE SHIELD

## 2018-07-08 ENCOUNTER — Encounter: Payer: BLUE CROSS/BLUE SHIELD | Admitting: Licensed Clinical Social Worker

## 2018-07-30 ENCOUNTER — Encounter: Payer: Self-pay | Admitting: Neurology

## 2018-08-08 ENCOUNTER — Ambulatory Visit: Payer: BLUE CROSS/BLUE SHIELD | Admitting: Neurology

## 2019-09-02 ENCOUNTER — Other Ambulatory Visit: Payer: Self-pay | Admitting: Obstetrics and Gynecology

## 2019-09-02 DIAGNOSIS — R928 Other abnormal and inconclusive findings on diagnostic imaging of breast: Secondary | ICD-10-CM

## 2019-09-10 ENCOUNTER — Encounter: Payer: BLUE CROSS/BLUE SHIELD | Admitting: Family Medicine

## 2019-09-11 ENCOUNTER — Ambulatory Visit
Admission: RE | Admit: 2019-09-11 | Discharge: 2019-09-11 | Disposition: A | Discharge status: Home / Self Care | Payer: BC Managed Care – PPO | Source: Ambulatory Visit | Attending: Obstetrics and Gynecology | Admitting: Obstetrics and Gynecology

## 2019-09-11 ENCOUNTER — Other Ambulatory Visit: Payer: Self-pay

## 2019-09-11 ENCOUNTER — Ambulatory Visit: Payer: BLUE CROSS/BLUE SHIELD

## 2019-09-11 DIAGNOSIS — R928 Other abnormal and inconclusive findings on diagnostic imaging of breast: Secondary | ICD-10-CM

## 2022-03-11 IMAGING — MG MM DIGITAL DIAGNOSTIC UNILAT*R* W/ TOMO W/ CAD
6 series · 6 of 18 positions shown · non-contrast
Comparison: Previous exam(s).

CLINICAL DATA: Screening recall for a right breast asymmetry.

EXAM:
DIGITAL DIAGNOSTIC UNILATERAL RIGHT MAMMOGRAM WITH CAD AND TOMO

[R MLO synth-2D (1 of 2)]
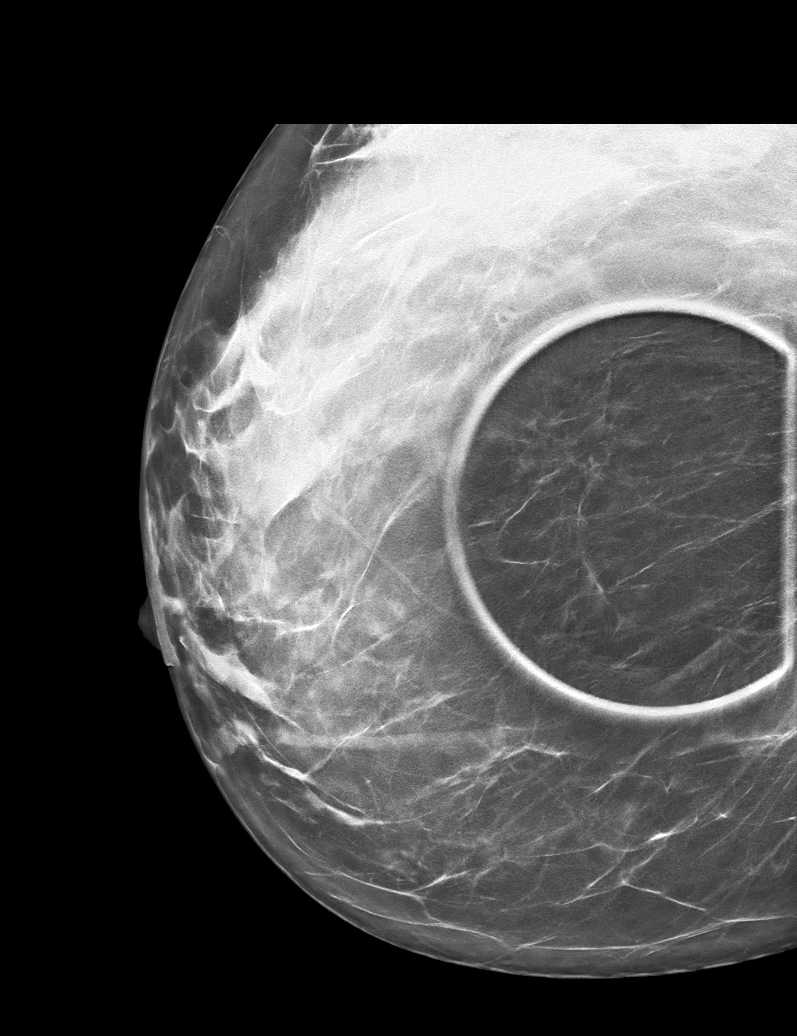

[R MLO synth-2D (2 of 2)]
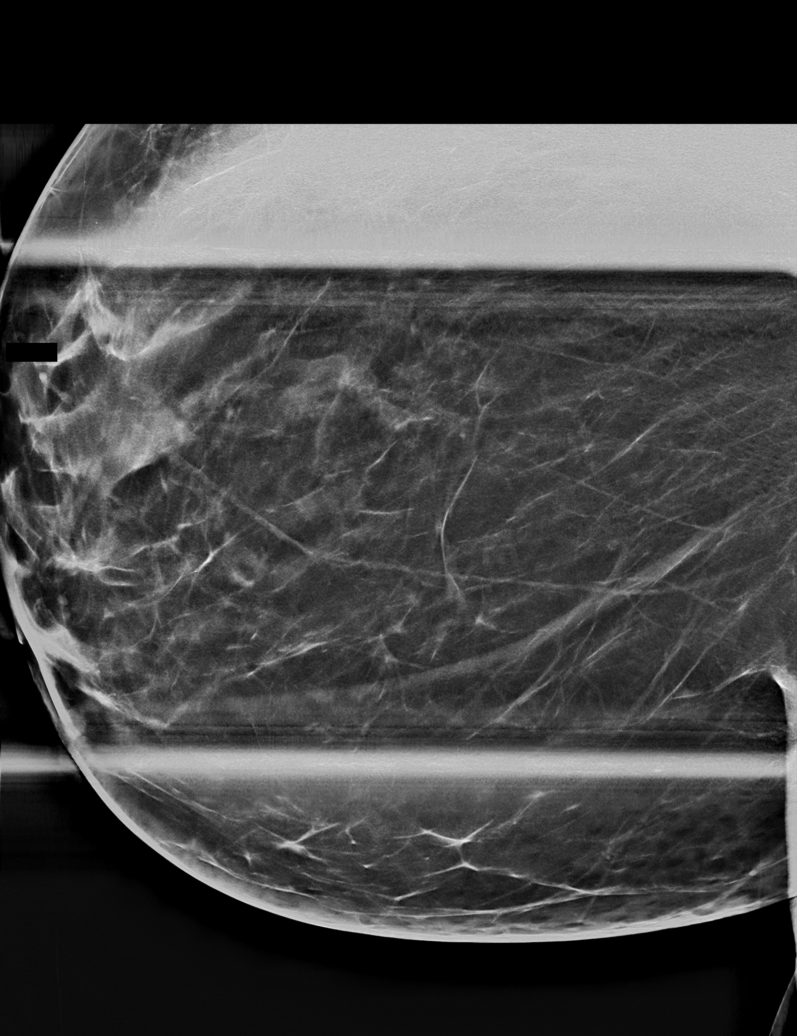

[R ML synth-2D]
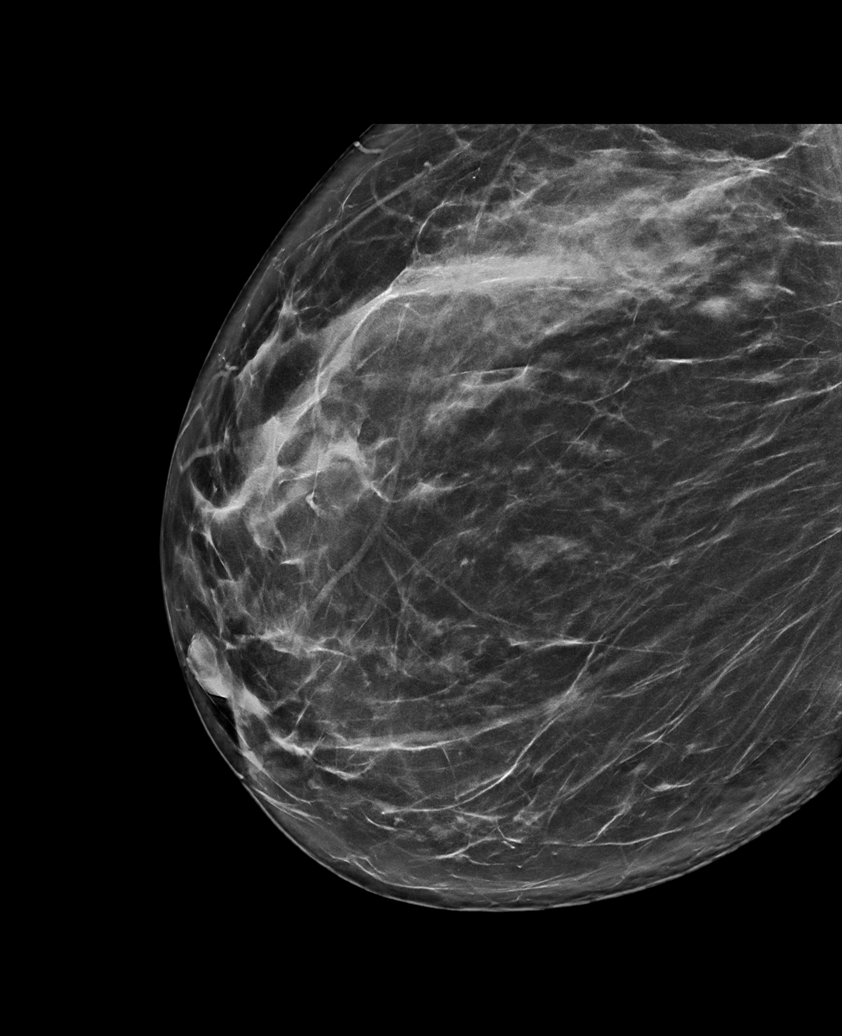

[R ML tomo · tomo slice 44/87.0]
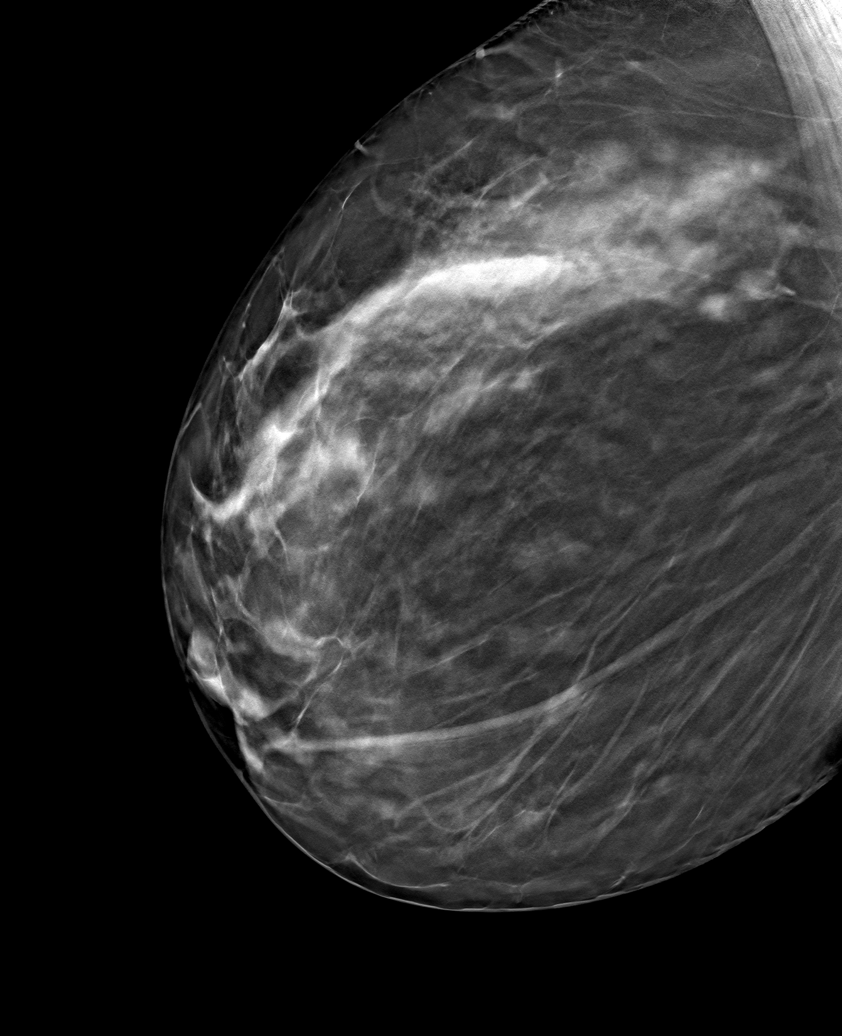

[R MLO tomo (1 of 2) · tomo slice 39/78.0]
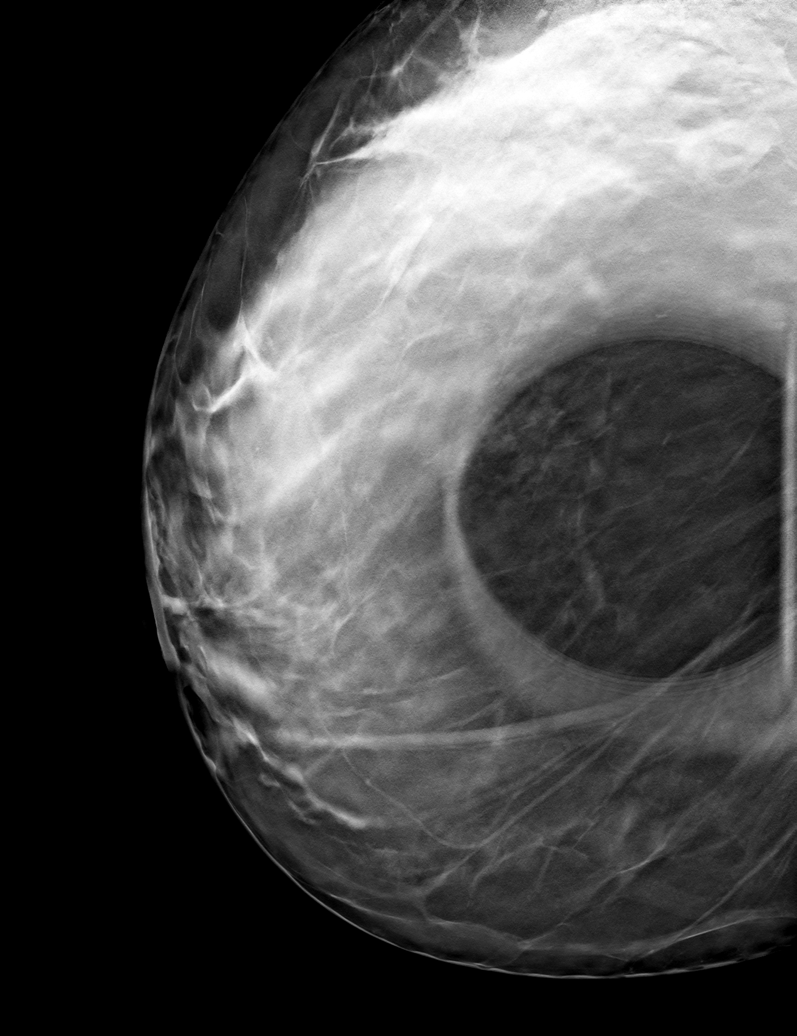

[R MLO tomo (2 of 2) · tomo slice 39/78.0]
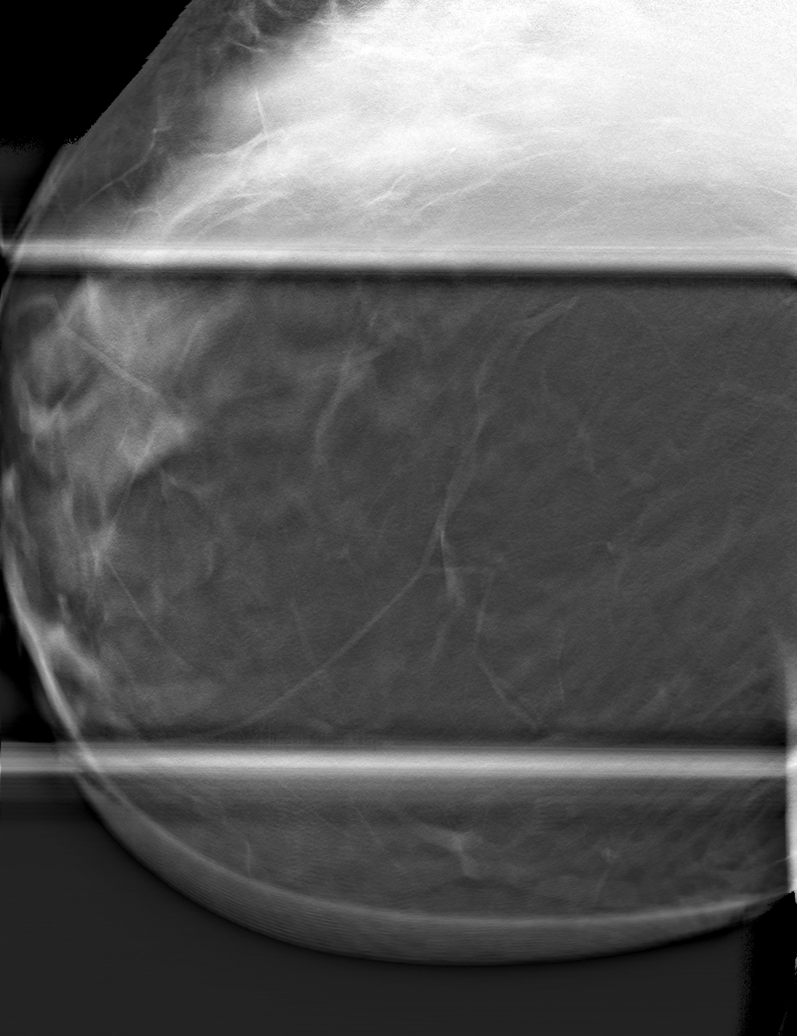

[6 of 18 positions shown; findings below may reference images not displayed]

ACR Breast Density Category c: The breast tissue is heterogeneously
dense, which may obscure small masses.
FINDINGS: The asymmetry of concern in the central right breast on the MLO view
is less prominent on the spot compression tomosynthesis images. This
appearance is stable as compared to the 0340 mammogram, and is
therefore benign.

Mammographic images were processed with CAD.
IMPRESSION: No persistent suspicious abnormalities are seen in the right breast.

RECOMMENDATION:
Screening mammogram in one year.(Code:[id number])

I have discussed the findings and recommendations with the patient.
If applicable, a reminder letter will be sent to the patient
regarding the next appointment.

BI-RADS CATEGORY  1: Negative.

## 2022-12-26 ENCOUNTER — Ambulatory Visit (HOSPITAL_BASED_OUTPATIENT_CLINIC_OR_DEPARTMENT_OTHER): Payer: BC Managed Care – PPO | Attending: Family Medicine | Admitting: Physical Therapy

## 2022-12-26 ENCOUNTER — Other Ambulatory Visit: Payer: Self-pay

## 2022-12-26 ENCOUNTER — Encounter (HOSPITAL_BASED_OUTPATIENT_CLINIC_OR_DEPARTMENT_OTHER): Payer: Self-pay | Admitting: Physical Therapy

## 2022-12-26 DIAGNOSIS — M25511 Pain in right shoulder: Secondary | ICD-10-CM | POA: Insufficient documentation

## 2022-12-26 DIAGNOSIS — M25611 Stiffness of right shoulder, not elsewhere classified: Secondary | ICD-10-CM | POA: Insufficient documentation

## 2022-12-26 NOTE — Therapy (Signed)
OUTPATIENT PHYSICAL THERAPY EVALUATION   Patient Name: Judy Snow MRN: [id number] DOB:October 11, 1975, 47 y.o., female Today's Date: 12/26/2022  END OF SESSION:  PT End of Session - 12/26/22 1103     Visit Number 1    Number of Visits 13    Date for PT Re-Evaluation 02/24/23    PT Start Time 1023    PT Stop Time 1103    PT Time Calculation (min) 40 min    Activity Tolerance Patient tolerated treatment well    Behavior During Therapy [facility] for tasks assessed/performed             Past Medical History:  Diagnosis Date   Chest pain    a. 05/2016 ETT: Ex time 9:35, Max HR 176, upsloping ST dep-->Negative Adequate study.   Palpitations    a. 05/2016 Event Monitor: Avg HR 76 (low 52, max 173), predominantly RSR. Lightheadedness assoc w/ RSR, c/p assoc w/ sinus tach and RSR, fluttering assoc w/ RSR & PAC's.   Syncope    a. 05/2016 while sitting in ER with atypical c/p and palpitations/sinus tachycardia;  b. 06/2016 Echo: EF 55-60%.   History reviewed. No pertinent surgical history. Patient Active Problem List   Diagnosis Date Noted   Palpitations 09/19/2016   Migraine with aura and without status migrainosus, not intractable 09/19/2016   History of cesarean section, low transverse 01/22/2011     REFERRING PROVIDER:  Arlyce Harman, MD     REFERRING DIAG:  M25.511 (ICD-10-CM) - Pain in right shoulder  Rationale for Evaluation and Treatment: Rehabilitation  THERAPY DIAG:  Acute pain of right shoulder  Stiffness of right shoulder, not elsewhere classified  ONSET DATE: oct 2023   SUBJECTIVE:                                                                                                                                                                                           SUBJECTIVE STATEMENT: H/o migraines 13 years. Last October went to massage- my shoulder was not sitting right when I layed down. Toward April started having pain and ROM has decreased. Dog pulled the  leash on Saturday. Cortisone shot earlier in the summer that did nothing.   PERTINENT HISTORY:  N/a  PAIN:  Are you having pain? Yes: NPRS scale: 2/10 Pain location: Rt shoulder Pain description: ache, sin feels sunburned since shot Aggravating factors: throwing, quick motions Relieving factors: hot tub  PRECAUTIONS:  None  RED FLAGS: None   WEIGHT BEARING RESTRICTIONS:  No  FALLS:  Has patient fallen in last 6 months? No  LIVING ENVIRONMENT: Lives with: lives with their family  OCCUPATION:  Home schooling, photographer  PLOF:  Independent  PATIENT GOALS:  ROM, decrease pain   OBJECTIVE:   DIAGNOSTIC FINDINGS:  MRI 12/16/22- mild supraspinatus fraying and subacromial bursitis  COGNITIVE STATUS: Within functional limits for tasks assessed   SENSATION: Eval: pt reports sensation of skin sunburn on dorsal aspect of arm since injection  POSTURE:  Eval: rounded shoulders, Rt scapualr winging  HAND DOMINANCE:  Right  Outcome measures:  UEFI- to be completed at visit 2   Body Part #1 Shoulder  PALPATION: Eval: stretchy end feel  UE ROM UPPER EXTREMITY ROM:  Active ROM/passive Right eval Left eval  Shoulder flexion 100/124   Shoulder extension 25   Shoulder abduction 76   Shoulder adduction    Shoulder extension    Shoulder internal rotation Lower/lat Rt buttock   Shoulder external rotation C5    (Blank rows = not tested)      TREATMENT:                                                                                                                               DATE: eval STM upper trap, levator & subscap; PROM flexion; first rib depression Scapular retraction Cervical rotation SNAG Cervical sidebend with first rib depression    PATIENT EDUCATION:  Education details: Anatomy of condition, POC, HEP, exercise form/rationale Person educated: Patient Education method: Explanation, Demonstration, Tactile cues, Verbal cues, and  Handouts Education comprehension: verbalized understanding, returned demonstration, verbal cues required, tactile cues required, and needs further education  HOME EXERCISE PROGRAM: [id number]   ASSESSMENT:  CLINICAL IMPRESSION: Patient is a 47 y.o. F who was seen today for physical therapy evaluation and treatment for right shoulder pain due to adhesive capsulitis. Significant tightness in upper trap creating first rib elevation and subscapularis. Was able to improve passive flexion with STM today- would benefit from [facility].     REHAB POTENTIAL: Good  CLINICAL DECISION MAKING: Stable/uncomplicated  EVALUATION COMPLEXITY: Low   GOALS: Goals reviewed with patient? Yes  SHORT TERM GOALS: Target date: 01/20/23  Able to maintain enough ROM for independence in fixing hair Baseline: Goal status: INITIAL  2.  Independent in HEP with pain <=3/10 Baseline:  Goal status: INITIAL  LONG TERM GOALS: Target date: POC date  ROM 85% of opp UE Baseline:  Goal status: INITIAL  2.  Able to lift 10lb overhead wihtout increased pain Baseline:  Goal status: INITIAL  3.  UEFI to improve by MDC Baseline: to be completed at visit 2 Goal status: INITIAL    PLAN:  PT FREQUENCY: 1-2x/week  PT DURATION: 8 weeks  PLANNED INTERVENTIONS: Therapeutic exercises, Therapeutic activity, Neuromuscular re-education, Patient/Family education, Self Care, Joint mobilization, Aquatic Therapy, Dry Needling, Electrical stimulation, Spinal mobilization, Cryotherapy, Moist heat, Taping, Traction, Ultrasound, Ionotophoresis 4mg /ml Dexamethasone, Manual therapy, and Re-evaluation.  PLAN FOR NEXT SESSION: give UEFI & document, DN subcsap, upper trap & levator   Wynn Kernes C. Madalen Gavin PT, DPT 12/26/22 3:51 PM

## 2023-01-02 ENCOUNTER — Encounter (HOSPITAL_BASED_OUTPATIENT_CLINIC_OR_DEPARTMENT_OTHER): Payer: Self-pay | Admitting: Physical Therapy

## 2023-01-02 ENCOUNTER — Ambulatory Visit (HOSPITAL_BASED_OUTPATIENT_CLINIC_OR_DEPARTMENT_OTHER): Payer: BC Managed Care – PPO | Admitting: Physical Therapy

## 2023-01-02 DIAGNOSIS — M25511 Pain in right shoulder: Secondary | ICD-10-CM | POA: Diagnosis not present

## 2023-01-02 DIAGNOSIS — M25611 Stiffness of right shoulder, not elsewhere classified: Secondary | ICD-10-CM

## 2023-01-02 NOTE — Therapy (Addendum)
 OUTPATIENT PHYSICAL THERAPY TREATMENT PHYSICAL THERAPY DISCHARGE SUMMARY  Visits from Start of Care: 2  Current functional level related to goals / functional outcomes: unknown   Remaining deficits: unknown   Education / Equipment: Initial EDU see evaluation/ initial HEP   Patient agrees to discharge. Patient goals were not met. Patient is being discharged due to not returning since the last visit. Addend Corrie Dandy Tomma Lightning) Ziemba MPT 06/29/23 1:05 PM [facility] [address] [place], [place], [address] Phone: [phone]   Fax:  [phone]    Patient Name: Judy Snow MRN: [id number] DOB:06/10/1975, 47 y.o., female Today's Date: 01/02/2023  END OF SESSION:  PT End of Session - 01/02/23 0935     Visit Number 2    Number of Visits 13    Date for PT Re-Evaluation 02/24/23    PT Start Time 0935    PT Stop Time 1014    PT Time Calculation (min) 39 min    Activity Tolerance Patient tolerated treatment well    Behavior During Therapy [facility] for tasks assessed/performed             Past Medical History:  Diagnosis Date   Chest pain    a. 05/2016 ETT: Ex time 9:35, Max HR 176, upsloping ST dep-->Negative Adequate study.   Palpitations    a. 05/2016 Event Monitor: Avg HR 76 (low 52, max 173), predominantly RSR. Lightheadedness assoc w/ RSR, c/p assoc w/ sinus tach and RSR, fluttering assoc w/ RSR & PAC's.   Syncope    a. 05/2016 while sitting in ER with atypical c/p and palpitations/sinus tachycardia;  b. 06/2016 Echo: EF 55-60%.   History reviewed. No pertinent surgical history. Patient Active Problem List   Diagnosis Date Noted   Palpitations 09/19/2016   Migraine with aura and without status migrainosus, not intractable 09/19/2016   History of cesarean section, low transverse 01/22/2011     REFERRING PROVIDER:  Arlyce Harman, MD     REFERRING DIAG:  M25.511 (ICD-10-CM) - Pain in right  shoulder  Rationale for Evaluation and Treatment: Rehabilitation  THERAPY DIAG:  Acute pain of right shoulder  Stiffness of right shoulder, not elsewhere classified  ONSET DATE: oct 2023   SUBJECTIVE:                                                                                                                                                                                           SUBJECTIVE STATEMENT: Patient states Shoulder is about the same. Pain with rolling/ pushing with bed mobility.  EVAL: H/o migraines 13 years. Last October went to massage- my shoulder was  not sitting right when I layed down. Toward April started having pain and ROM has decreased. Dog pulled the leash on Saturday. Cortisone shot earlier in the summer that did nothing.   PERTINENT HISTORY:  N/a  PAIN:  Are you having pain? Yes: NPRS scale: 2/10 Pain location: Rt shoulder Pain description: ache, sin feels sunburned since shot Aggravating factors: throwing, quick motions Relieving factors: hot tub  PRECAUTIONS:  None  RED FLAGS: None   WEIGHT BEARING RESTRICTIONS:  No  FALLS:  Has patient fallen in last 6 months? No  LIVING ENVIRONMENT: Lives with: lives with their family  OCCUPATION:  Home schooling, photographer  PLOF:  Independent  PATIENT GOALS:  ROM, decrease pain   OBJECTIVE:   DIAGNOSTIC FINDINGS:  MRI 12/16/22- mild supraspinatus fraying and subacromial bursitis  COGNITIVE STATUS: Within functional limits for tasks assessed   SENSATION: Eval: pt reports sensation of skin sunburn on dorsal aspect of arm since injection  POSTURE:  Eval: rounded shoulders, Rt scapualr winging  HAND DOMINANCE:  Right  Outcome measures:  UEFI- 01/02/23 36/80   Body Part #1 Shoulder  PALPATION: Eval: stretchy end feel  UE ROM UPPER EXTREMITY ROM:  Active ROM/passive Right eval Left eval  Shoulder flexion 100/124   Shoulder extension 25   Shoulder abduction 76   Shoulder  adduction    Shoulder extension    Shoulder internal rotation Lower/lat Rt buttock   Shoulder external rotation C5    (Blank rows = not tested)      TREATMENT:                                                                                                                              01/02/23 TTP  in R UT, levator scapulae  Manual: STM to R UT  pre and post dry needling for trigger point identification and muscular relaxation. Trigger Point Dry-Needling  Treatment instructions: Expect mild to moderate muscle soreness. S/S of pneumothorax if dry needled over a lung field, and to seek immediate medical attention should they occur. Patient verbalized understanding of these instructions and education.  Patient Consent Given: Yes Education handout provided: Yes Muscles treated: R UT Electrical stimulation performed: No Parameters: N/A Treatment response/outcome: twitch response elicited, decrease in tissue tension Seated UT stretch 3 x 20 second holds Seated scap retraction 1 x 15 Seated cervical rotation SNAG 10 x 5 second holds bilateral  Supine shoulder flexion with band between hands RTB 2 x 10 Supine bilateral ER RTB 2 x 10   DATE: eval STM upper trap, levator & subscap; PROM flexion; first rib depression Scapular retraction Cervical rotation SNAG Cervical sidebend with first rib depression    PATIENT EDUCATION:  Education details: Anatomy of condition, POC, HEP, exercise form/rationale 01/02/23 DN, HEP Person educated: Patient Education method: Explanation, Demonstration, Tactile cues, Verbal cues, and Handouts Education comprehension: verbalized understanding, returned demonstration, verbal cues required, tactile cues required, and needs further education  HOME EXERCISE  PROGRAM: [id number]   ASSESSMENT:  CLINICAL IMPRESSION: Began session with DN to R UT with twitch response elicited and decrease in tissue tension. Continued with cervical mobility exercises which are  tolerated well. Began supine band exercises for periscap and RC strength which are performed with good mechanics after initial cueing. Patient will continue to benefit from physical therapy in order to improve function and reduce impairment.     REHAB POTENTIAL: Good  CLINICAL DECISION MAKING: Stable/uncomplicated  EVALUATION COMPLEXITY: Low   GOALS: Goals reviewed with patient? Yes  SHORT TERM GOALS: Target date: 01/20/23  Able to maintain enough ROM for independence in fixing hair Baseline: Goal status: INITIAL  2.  Independent in HEP with pain <=3/10 Baseline:  Goal status: INITIAL  LONG TERM GOALS: Target date: POC date  ROM 85% of opp UE Baseline:  Goal status: INITIAL  2.  Able to lift 10lb overhead wihtout increased pain Baseline:  Goal status: INITIAL  3.  UEFI to improve by MDC Baseline: to be completed at visit 2 Goal status: INITIAL    PLAN:  PT FREQUENCY: 1-2x/week  PT DURATION: 8 weeks  PLANNED INTERVENTIONS: Therapeutic exercises, Therapeutic activity, Neuromuscular re-education, Patient/Family education, Self Care, Joint mobilization, Aquatic Therapy, Dry Needling, Electrical stimulation, Spinal mobilization, Cryotherapy, Moist heat, Taping, Traction, Ultrasound, Ionotophoresis 4mg /ml Dexamethasone, Manual therapy, and Re-evaluation.  PLAN FOR NEXT SESSION: DN subcsap, upper trap & levator    Reola Mosher Keaghan Bowens, PT 01/02/2023, 9:36 AM

## 2023-01-02 NOTE — Patient Instructions (Signed)

## 2023-01-05 ENCOUNTER — Ambulatory Visit (HOSPITAL_BASED_OUTPATIENT_CLINIC_OR_DEPARTMENT_OTHER): Payer: BC Managed Care – PPO | Admitting: Physical Therapy

## 2023-01-12 ENCOUNTER — Ambulatory Visit (HOSPITAL_BASED_OUTPATIENT_CLINIC_OR_DEPARTMENT_OTHER): Payer: BC Managed Care – PPO | Admitting: Physical Therapy

## 2023-01-16 ENCOUNTER — Encounter (HOSPITAL_BASED_OUTPATIENT_CLINIC_OR_DEPARTMENT_OTHER): Payer: BC Managed Care – PPO | Admitting: Physical Therapy

## 2023-01-30 ENCOUNTER — Encounter (HOSPITAL_BASED_OUTPATIENT_CLINIC_OR_DEPARTMENT_OTHER): Payer: BC Managed Care – PPO | Admitting: Physical Therapy

## 2023-02-02 ENCOUNTER — Encounter (HOSPITAL_BASED_OUTPATIENT_CLINIC_OR_DEPARTMENT_OTHER): Payer: BC Managed Care – PPO | Admitting: Physical Therapy

## 2023-02-06 ENCOUNTER — Encounter (HOSPITAL_BASED_OUTPATIENT_CLINIC_OR_DEPARTMENT_OTHER): Payer: BC Managed Care – PPO | Admitting: Physical Therapy

## 2023-02-13 ENCOUNTER — Encounter (HOSPITAL_BASED_OUTPATIENT_CLINIC_OR_DEPARTMENT_OTHER): Payer: BC Managed Care – PPO | Admitting: Physical Therapy

## 2023-02-20 ENCOUNTER — Encounter (HOSPITAL_BASED_OUTPATIENT_CLINIC_OR_DEPARTMENT_OTHER): Payer: BC Managed Care – PPO | Admitting: Physical Therapy

## 2023-05-16 ENCOUNTER — Other Ambulatory Visit (HOSPITAL_COMMUNITY)
Admission: RE | Admit: 2023-05-16 | Discharge: 2023-05-16 | Disposition: A | Discharge status: Home / Self Care | Payer: BC Managed Care – PPO | Source: Ambulatory Visit | Attending: Certified Nurse Midwife | Admitting: Certified Nurse Midwife

## 2023-05-16 ENCOUNTER — Ambulatory Visit (HOSPITAL_BASED_OUTPATIENT_CLINIC_OR_DEPARTMENT_OTHER): Payer: BC Managed Care – PPO | Admitting: Certified Nurse Midwife

## 2023-05-16 ENCOUNTER — Encounter (HOSPITAL_BASED_OUTPATIENT_CLINIC_OR_DEPARTMENT_OTHER): Payer: Self-pay | Admitting: Certified Nurse Midwife

## 2023-05-16 VITALS — BP 124/73 | HR 90 | Ht 68.0 in | Wt 205.8 lb

## 2023-05-16 DIAGNOSIS — F430 Acute stress reaction: Secondary | ICD-10-CM | POA: Diagnosis not present

## 2023-05-16 DIAGNOSIS — F411 Generalized anxiety disorder: Secondary | ICD-10-CM | POA: Diagnosis not present

## 2023-05-16 DIAGNOSIS — Z1231 Encounter for screening mammogram for malignant neoplasm of breast: Secondary | ICD-10-CM

## 2023-05-16 DIAGNOSIS — Z124 Encounter for screening for malignant neoplasm of cervix: Secondary | ICD-10-CM | POA: Insufficient documentation

## 2023-05-16 DIAGNOSIS — Z113 Encounter for screening for infections with a predominantly sexual mode of transmission: Secondary | ICD-10-CM | POA: Insufficient documentation

## 2023-05-16 DIAGNOSIS — Z01411 Encounter for gynecological examination (general) (routine) with abnormal findings: Secondary | ICD-10-CM

## 2023-05-16 MED ORDER — ESCITALOPRAM OXALATE 10 MG PO TABS: 10.0000 mg | ORAL_TABLET | Freq: Every day | ORAL | 12 refills | Status: AC
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
## 2023-05-22 LAB — CYTOLOGY - PAP
Chlamydia: NEGATIVE
Comment: NEGATIVE
Comment: NEGATIVE
Comment: NEGATIVE
Comment: NORMAL
Diagnosis: NEGATIVE
High risk HPV: NEGATIVE
Neisseria Gonorrhea: NEGATIVE
Trichomonas: NEGATIVE

## 2023-05-23 ENCOUNTER — Encounter (HOSPITAL_BASED_OUTPATIENT_CLINIC_OR_DEPARTMENT_OTHER): Payer: Self-pay | Admitting: Certified Nurse Midwife

## 2023-06-13 ENCOUNTER — Ambulatory Visit (HOSPITAL_BASED_OUTPATIENT_CLINIC_OR_DEPARTMENT_OTHER): Payer: BC Managed Care – PPO | Admitting: Certified Nurse Midwife

## 2023-06-13 ENCOUNTER — Encounter (HOSPITAL_BASED_OUTPATIENT_CLINIC_OR_DEPARTMENT_OTHER): Payer: Self-pay

## 2023-06-13 ENCOUNTER — Other Ambulatory Visit (HOSPITAL_BASED_OUTPATIENT_CLINIC_OR_DEPARTMENT_OTHER): Payer: BC Managed Care – PPO

## 2023-10-18 ENCOUNTER — Encounter (HOSPITAL_BASED_OUTPATIENT_CLINIC_OR_DEPARTMENT_OTHER): Payer: Self-pay | Admitting: Emergency Medicine

## 2023-10-18 ENCOUNTER — Other Ambulatory Visit (HOSPITAL_BASED_OUTPATIENT_CLINIC_OR_DEPARTMENT_OTHER): Payer: Self-pay

## 2023-10-18 ENCOUNTER — Emergency Department (HOSPITAL_BASED_OUTPATIENT_CLINIC_OR_DEPARTMENT_OTHER)

## 2023-10-18 ENCOUNTER — Emergency Department (HOSPITAL_BASED_OUTPATIENT_CLINIC_OR_DEPARTMENT_OTHER)
Admission: EM | Admit: 2023-10-18 | Discharge: 2023-10-18 | Disposition: A | Discharge status: Home / Self Care | Attending: Emergency Medicine | Admitting: Emergency Medicine

## 2023-10-18 ENCOUNTER — Other Ambulatory Visit: Payer: Self-pay

## 2023-10-18 DIAGNOSIS — Z79899 Other long term (current) drug therapy: Secondary | ICD-10-CM | POA: Diagnosis not present

## 2023-10-18 DIAGNOSIS — R197 Diarrhea, unspecified: Secondary | ICD-10-CM | POA: Insufficient documentation

## 2023-10-18 DIAGNOSIS — R1011 Right upper quadrant pain: Secondary | ICD-10-CM | POA: Insufficient documentation

## 2023-10-18 DIAGNOSIS — R112 Nausea with vomiting, unspecified: Secondary | ICD-10-CM | POA: Insufficient documentation

## 2023-10-18 DIAGNOSIS — E039 Hypothyroidism, unspecified: Secondary | ICD-10-CM | POA: Insufficient documentation

## 2023-10-18 LAB — COMPREHENSIVE METABOLIC PANEL WITH GFR
ALT: 10 U/L (ref 0–44)
AST: 16 U/L (ref 15–41)
Albumin: 4.8 g/dL (ref 3.5–5.0)
Alkaline Phosphatase: 79 U/L (ref 38–126)
Anion gap: 13 (ref 5–15)
BUN: 13 mg/dL (ref 6–20)
CO2: 21 mmol/L — ABNORMAL LOW (ref 22–32)
Calcium: 9.8 mg/dL (ref 8.9–10.3)
Chloride: 105 mmol/L (ref 98–111)
Creatinine, Ser: 0.88 mg/dL (ref 0.44–1.00)
GFR, Estimated: >60 mL/min (ref >60)
Glucose, Bld: 93 mg/dL (ref 70–99)
Potassium: 4 mmol/L (ref 3.5–5.1)
Sodium: 138 mmol/L (ref 135–145)
Total Bilirubin: 0.7 mg/dL (ref 0.0–1.2)
Total Protein: 7.8 g/dL (ref 6.5–8.1)

## 2023-10-18 LAB — LIPASE, BLOOD: Lipase: 30 U/L (ref 11–51)

## 2023-10-18 LAB — URINALYSIS, ROUTINE W REFLEX MICROSCOPIC
Bilirubin Urine: NEGATIVE
Glucose, UA: NEGATIVE mg/dL
Hgb urine dipstick: NEGATIVE
Ketones, ur: 15 mg/dL — AB
Leukocytes,Ua: NEGATIVE
Nitrite: NEGATIVE
Protein, ur: NEGATIVE mg/dL
Specific Gravity, Urine: 1.01 (ref 1.005–1.030)
pH: 5.5 (ref 5.0–8.0)

## 2023-10-18 LAB — CBC
HCT: 45.4 % (ref 36.0–46.0)
Hemoglobin: 15.4 g/dL — ABNORMAL HIGH (ref 12.0–15.0)
MCH: 30.7 pg (ref 26.0–34.0)
MCHC: 33.9 g/dL (ref 30.0–36.0)
MCV: 90.6 fL (ref 80.0–100.0)
Platelets: 337 10*3/uL (ref 150–400)
RBC: 5.01 MIL/uL (ref 3.87–5.11)
RDW: 12.5 % (ref 11.5–15.5)
WBC: 12.8 10*3/uL — ABNORMAL HIGH (ref 4.0–10.5)
nRBC: 0 % (ref 0.0–0.2)

## 2023-10-18 LAB — PREGNANCY, URINE: Preg Test, Ur: NEGATIVE

## 2023-10-18 MED ORDER — LACTATED RINGERS IV BOLUS
1000.0000 mL | Freq: Once | INTRAVENOUS | Status: AC
  Administered 2023-10-18: 1000 mL via INTRAVENOUS

## 2023-10-18 MED ORDER — ONDANSETRON HCL 4 MG/2ML IJ SOLN
4.0000 mg | Freq: Once | INTRAMUSCULAR | Status: AC
  Administered 2023-10-18: 4 mg via INTRAVENOUS

## 2023-10-18 MED ORDER — METOCLOPRAMIDE HCL 10 MG PO TABS
10.0000 mg | ORAL_TABLET | Freq: Four times a day (QID) | ORAL | 0 refills | Status: AC
  Filled 2023-10-18: qty 30, 8d supply, fill #0

## 2023-10-18 MED ORDER — ONDANSETRON HCL 4 MG PO TABS
4.0000 mg | ORAL_TABLET | Freq: Four times a day (QID) | ORAL | 0 refills | Status: AC
  Filled 2023-10-18: qty 12, 3d supply, fill #0

## 2023-10-18 MED ORDER — METOCLOPRAMIDE HCL 5 MG/ML IJ SOLN
10.0000 mg | Freq: Once | INTRAMUSCULAR | Status: AC
  Administered 2023-10-18: 10 mg via INTRAVENOUS
  Filled 2023-10-18: qty 2

## 2023-10-18 NOTE — Discharge Instructions (Addendum)
 While you were in the emergency room, you had blood work done that was normal.  I have sent you prescriptions for nausea medications.  You can take 4 mg of Zofran every 8 hours, 10 mg of Reglan every 6 hours.  Try to eat foods that are gentle on your stomach over the next few days.  Follow-up with your primary care doctor.  Return to the emergency room if you develop fever, pain in your abdomen, inability eat or drink.  Your gallbladder ultrasound did not show any stones, or signs of infection.

## 2023-10-18 NOTE — ED Triage Notes (Signed)
 Pt caox4, ambulatory c/o N/V/D x2 days stating she has not even been able to drink water because she vomits. Denies anyone in household with similar s/s.

## 2023-10-18 NOTE — ED Provider Notes (Signed)
 [place] EMERGENCY DEPARTMENT AT [facility] Provider Note  CSN: [id number] Arrival date & time: 10/18/23 0845  Chief Complaint(s) Emesis  HPI Judy Snow is a 48 y.o. female who is here today for 2 days of nausea vomiting and diarrhea.  She has not had any abdominal pain.  She has a past history of hypothyroidism.  Only intra-abdominal surgeries are C-sections.  Patient states that over the last couple of days, no matter what she eats she begins to feel nauseated, have vomiting.  She has had a few episodes of diarrhea.  Denies fever or chills.   Past Medical History Past Medical History:  Diagnosis Date   Chest pain    a. 05/2016 ETT: Ex time 9:35, Max HR 176, upsloping ST dep-->Negative Adequate study.   Palpitations    a. 05/2016 Event Monitor: Avg HR 76 (low 52, max 173), predominantly RSR. Lightheadedness assoc w/ RSR, c/p assoc w/ sinus tach and RSR, fluttering assoc w/ RSR & PAC's.   Syncope    a. 05/2016 while sitting in ER with atypical c/p and palpitations/sinus tachycardia;  b. 06/2016 Echo: EF 55-60%.   Patient Active Problem List   Diagnosis Date Noted   Palpitations 09/19/2016   Migraine with aura and without status migrainosus, not intractable 09/19/2016   History of cesarean section, low transverse 01/22/2011   Home Medication(s) Prior to Admission medications   Medication Sig Start Date End Date Taking? Authorizing Provider  metoCLOPramide (REGLAN) 10 MG tablet Take 1 tablet (10 mg total) by mouth every 6 (six) hours. 10/18/23  Yes Mannie Pac T, DO  ondansetron (ZOFRAN) 4 MG tablet Take 1 tablet (4 mg total) by mouth every 6 (six) hours. 10/18/23  Yes Mannie Pac T, DO  calcium-vitamin D  250-100 MG-UNIT tablet Take 1 tablet by mouth 2 (two) times daily.    [provider]  escitalopram  (LEXAPRO ) 10 MG tablet Take 1 tablet (10 mg total) by mouth at bedtime. 05/16/23   Tad Arland POUR, CNM  Multiple Vitamins-Minerals (MULTIPLE VITAMINS/WOMENS  PO) Take 1 tablet by mouth daily.    [provider]  progesterone (PROMETRIUM) 200 MG capsule Take by mouth. 03/24/23   [provider]  TESTOSTERONE COMPOUNDING KIT TD Place onto the skin. Tablet    [provider]  thyroid (ARMOUR) 120 MG tablet Take 120 mg by mouth daily before breakfast.    [provider]                                                                                                                                    Past Surgical History History reviewed. No pertinent surgical history. Family History Family History  Problem Relation Age of Onset   Hyperlipidemia Mother    Hypothyroidism Father    Ovarian cancer Maternal Grandmother    Heart attack Cousin 30    Social History Social History   Tobacco Use   Smoking  status: Never   Smokeless tobacco: Never  Substance Use Topics   Alcohol use: Yes    Comment: occasional drink.   Drug use: No   Allergies Shrimp [shellfish allergy], Codeine, and Sulfa antibiotics  Review of Systems Review of Systems  Physical Exam Vital Signs  I have reviewed the triage vital signs BP 124/74   Pulse 76   Temp 97.9 F (36.6 C) (Oral)   Resp 20   LMP 10/10/2023 (Approximate)   SpO2 98%   Physical Exam Vitals and nursing note reviewed.   Cardiovascular:     Rate and Rhythm: Normal rate.  Pulmonary:     Effort: Pulmonary effort is normal.  Abdominal:     General: Abdomen is flat. There is no distension.     Palpations: Abdomen is soft. There is no mass.     Tenderness: There is no abdominal tenderness. There is no right CVA tenderness or guarding.     Hernia: No hernia is present.   Musculoskeletal:     Cervical back: Normal range of motion.   Neurological:     Mental Status: She is alert.     ED Results and Treatments Labs (all labs ordered are listed, but only abnormal results are displayed) Labs Reviewed  COMPREHENSIVE METABOLIC PANEL WITH GFR - Abnormal; Notable  for the following components:      Result Value   CO2 21 (*)    All other components within normal limits  CBC - Abnormal; Notable for the following components:   WBC 12.8 (*)    Hemoglobin 15.4 (*)    All other components within normal limits  URINALYSIS, ROUTINE W REFLEX MICROSCOPIC - Abnormal; Notable for the following components:   Ketones, ur 15 (*)    All other components within normal limits  LIPASE, BLOOD  PREGNANCY, URINE                                                                                                                          Radiology US  Abdomen Limited RUQ (LIVER/GB) Result Date: 10/18/2023 CLINICAL DATA:  Right upper quadrant abdominal pain EXAM: ULTRASOUND ABDOMEN LIMITED RIGHT UPPER QUADRANT COMPARISON:  None Available. FINDINGS: Gallbladder: No gallstones or wall thickening visualized. Mild gallbladder sludge noted. No sonographic Murphy sign noted by sonographer. Common bile duct: Diameter: 0.3 cm Liver: No focal lesion identified. Within normal limits in parenchymal echogenicity. Portal vein is patent on color Doppler imaging with normal direction of blood flow towards the liver. Other: None. IMPRESSION: 1. Mild gallbladder sludge. No gallstones or gallbladder wall thickening. Electronically Signed   By: Ryan Salvage M.D.   On: 10/18/2023 09:59    Pertinent labs & imaging results that were available during my care of the patient were reviewed by me and considered in my medical decision making (see MDM for details).  Medications Ordered in ED Medications  ondansetron (ZOFRAN) injection 4 mg (4 mg Intravenous Given 10/18/23 0906)  lactated ringers bolus 1,000 mL (0  mLs Intravenous Stopped 10/18/23 1046)  metoCLOPramide (REGLAN) injection 10 mg (10 mg Intravenous Given 10/18/23 1032)                                                                                                                                     Procedures Procedures  (including  critical care time)  Medical Decision Making / ED Course   This patient presents to the ED for concern of vomiting, this involves an extensive number of treatment options, and is a complaint that carries with it a high risk of complications and morbidity.  The differential diagnosis includes gastritis, gastroenteritis, biliary dyskinesia, less likely cholelithiasis, less likely cholecystitis, pregnancy, less likely hepatitis, less likely pancreatitis.  MDM: Patient with normal vital signs.  She has a soft abdomen.  Will begin symptomatic management of the patient Zofran fluids ordered.  In the absence of pain, fever, do not believe patient requires CT imaging at this time.  Discussed this with the patient she was agreeable with this plan.  She does report that her mother had gallbladder issues.  Do think is reasonable to get an ultrasound of the patient's gallbladder.  Lower suspicion for cholecystitis.  There is no chest pain, no shortness of breath.  With the diarrhea, do not believe this represents atypical presentation for ACS  Reassessment 12 PM-patient feeling significantly better after symptomatic treatment.  Reviewed the patient's labs, normal LFTs, normal renal function, normal electrolytes.  Negative pregnancy.  Will discharge patient.  My independent review of the patient's EKG shows no ST segment depressions or elevations, no T wave inversions, no evidence of acute ischemia.   Additional history obtained: -Additional history obtained from partner at bedside -External records from outside source obtained and reviewed including: Chart review including previous notes, labs, imaging, consultation notes   Lab Tests: -I ordered, reviewed, and interpreted labs.   The pertinent results include:   Labs Reviewed  COMPREHENSIVE METABOLIC PANEL WITH GFR - Abnormal; Notable for the following components:      Result Value   CO2 21 (*)    All other components within normal limits  CBC -  Abnormal; Notable for the following components:   WBC 12.8 (*)    Hemoglobin 15.4 (*)    All other components within normal limits  URINALYSIS, ROUTINE W REFLEX MICROSCOPIC - Abnormal; Notable for the following components:   Ketones, ur 15 (*)    All other components within normal limits  LIPASE, BLOOD  PREGNANCY, URINE        Imaging Studies ordered: I ordered imaging studies including gallbladder ultrasound I independently visualized and interpreted imaging. I agree with the radiologist interpretation   Medicines ordered and prescription drug management: Meds ordered this encounter  Medications   ondansetron (ZOFRAN) injection 4 mg   lactated ringers bolus 1,000 mL   metoCLOPramide (REGLAN) injection 10 mg   metoCLOPramide (REGLAN) 10 MG tablet  Sig: Take 1 tablet (10 mg total) by mouth every 6 (six) hours.    Dispense:  30 tablet    Refill:  0   ondansetron (ZOFRAN) 4 MG tablet    Sig: Take 1 tablet (4 mg total) by mouth every 6 (six) hours.    Dispense:  12 tablet    Refill:  0    -I have reviewed the patients home medicines and have made adjustments as needed   Cardiac Monitoring: The patient was maintained on a cardiac monitor.  I personally viewed and interpreted the cardiac monitored which showed an underlying rhythm of: Normal sinus rhythm  Social Determinants of Health:  Factors impacting patients care include: Lack of access to primary care   Reevaluation: After the interventions noted above, I reevaluated the patient and found that they have :improved  Co morbidities that complicate the patient evaluation  Past Medical History:  Diagnosis Date   Chest pain    a. 05/2016 ETT: Ex time 9:35, Max HR 176, upsloping ST dep-->Negative Adequate study.   Palpitations    a. 05/2016 Event Monitor: Avg HR 76 (low 52, max 173), predominantly RSR. Lightheadedness assoc w/ RSR, c/p assoc w/ sinus tach and RSR, fluttering assoc w/ RSR & PAC's.   Syncope    a.  05/2016 while sitting in ER with atypical c/p and palpitations/sinus tachycardia;  b. 06/2016 Echo: EF 55-60%.      Dispostion: I considered admission for this patient, however she is appropriate for discharge     Final Clinical Impression(s) / ED Diagnoses Final diagnoses:  Nausea and vomiting, unspecified vomiting type     @PCDICTATION @    Mannie Pac T, DO 10/18/23 1233

## 2024-05-17 ENCOUNTER — Encounter (HOSPITAL_BASED_OUTPATIENT_CLINIC_OR_DEPARTMENT_OTHER): Payer: Self-pay | Admitting: Obstetrics & Gynecology
